# Patient Record
Sex: Male | Born: 1955 | Race: White | Hispanic: No | Marital: Married | Smoking: Never smoker
Health system: Southern US, Community
[De-identification: ages and names within clinical notes are randomized; demographics above are authoritative.]

## PROBLEM LIST (undated history)

## (undated) DIAGNOSIS — M1990 Unspecified osteoarthritis, unspecified site: Secondary | ICD-10-CM

## (undated) DIAGNOSIS — J069 Acute upper respiratory infection, unspecified: Secondary | ICD-10-CM

## (undated) DIAGNOSIS — Z9889 Other specified postprocedural states: Secondary | ICD-10-CM

## (undated) DIAGNOSIS — N138 Other obstructive and reflux uropathy: Secondary | ICD-10-CM

## (undated) DIAGNOSIS — N529 Male erectile dysfunction, unspecified: Secondary | ICD-10-CM

## (undated) DIAGNOSIS — R740 Nonspecific elevation of levels of transaminase and lactic acid dehydrogenase [LDH]: Secondary | ICD-10-CM

## (undated) DIAGNOSIS — E669 Obesity, unspecified: Secondary | ICD-10-CM

## (undated) DIAGNOSIS — N401 Enlarged prostate with lower urinary tract symptoms: Secondary | ICD-10-CM

## (undated) DIAGNOSIS — R5383 Other fatigue: Secondary | ICD-10-CM

## (undated) DIAGNOSIS — J309 Allergic rhinitis, unspecified: Secondary | ICD-10-CM

## (undated) DIAGNOSIS — IMO0001 Reserved for inherently not codable concepts without codable children: Secondary | ICD-10-CM

## (undated) DIAGNOSIS — R112 Nausea with vomiting, unspecified: Secondary | ICD-10-CM

## (undated) DIAGNOSIS — R5381 Other malaise: Secondary | ICD-10-CM

## (undated) DIAGNOSIS — E1165 Type 2 diabetes mellitus with hyperglycemia: Secondary | ICD-10-CM

## (undated) DIAGNOSIS — J189 Pneumonia, unspecified organism: Secondary | ICD-10-CM

## (undated) DIAGNOSIS — E785 Hyperlipidemia, unspecified: Secondary | ICD-10-CM

## (undated) DIAGNOSIS — N400 Enlarged prostate without lower urinary tract symptoms: Secondary | ICD-10-CM

## (undated) DIAGNOSIS — M66259 Spontaneous rupture of extensor tendons, unspecified thigh: Secondary | ICD-10-CM

## (undated) DIAGNOSIS — I10 Essential (primary) hypertension: Secondary | ICD-10-CM

## (undated) DIAGNOSIS — K760 Fatty (change of) liver, not elsewhere classified: Secondary | ICD-10-CM

## (undated) DIAGNOSIS — R7401 Elevation of levels of liver transaminase levels: Secondary | ICD-10-CM

## (undated) DIAGNOSIS — G4730 Sleep apnea, unspecified: Secondary | ICD-10-CM

## (undated) HISTORY — DX: Essential (primary) hypertension: I10

## (undated) HISTORY — DX: Other obstructive and reflux uropathy: N13.8

## (undated) HISTORY — DX: Hyperlipidemia, unspecified: E78.5

## (undated) HISTORY — DX: Type 2 diabetes mellitus with hyperglycemia: E11.65

## (undated) HISTORY — DX: Other malaise: R53.81

## (undated) HISTORY — DX: Benign prostatic hyperplasia without lower urinary tract symptoms: N40.0

## (undated) HISTORY — DX: Fatty (change of) liver, not elsewhere classified: K76.0

## (undated) HISTORY — PX: TONSILLECTOMY: SUR1361

## (undated) HISTORY — DX: Spontaneous rupture of extensor tendons, unspecified thigh: M66.259

## (undated) HISTORY — DX: Male erectile dysfunction, unspecified: N52.9

## (undated) HISTORY — PX: OTHER SURGICAL HISTORY: SHX169

## (undated) HISTORY — DX: Obesity, unspecified: E66.9

## (undated) HISTORY — DX: Other obstructive and reflux uropathy: N40.1

## (undated) HISTORY — DX: Nonspecific elevation of levels of transaminase and lactic acid dehydrogenase (ldh): R74.0

## (undated) HISTORY — DX: Allergic rhinitis, unspecified: J30.9

## (undated) HISTORY — DX: Elevation of levels of liver transaminase levels: R74.01

## (undated) HISTORY — DX: Reserved for inherently not codable concepts without codable children: IMO0001

## (undated) HISTORY — DX: Other fatigue: R53.83

## (undated) HISTORY — DX: Sleep apnea, unspecified: G47.30

## (undated) HISTORY — DX: Acute upper respiratory infection, unspecified: J06.9

---

## 2011-05-11 LAB — HM DIABETES EYE EXAM

## 2013-03-24 ENCOUNTER — Other Ambulatory Visit: Payer: Self-pay

## 2013-03-31 ENCOUNTER — Ambulatory Visit: Payer: Self-pay | Admitting: Family Medicine

## 2013-04-26 ENCOUNTER — Ambulatory Visit (INDEPENDENT_AMBULATORY_CARE_PROVIDER_SITE_OTHER): Payer: No Typology Code available for payment source | Admitting: Family Medicine

## 2013-04-26 ENCOUNTER — Encounter: Payer: Self-pay | Admitting: Family Medicine

## 2013-04-26 VITALS — BP 100/67 | HR 101 | Temp 97.6°F | Wt 211.0 lb

## 2013-04-26 DIAGNOSIS — R5381 Other malaise: Secondary | ICD-10-CM

## 2013-04-26 DIAGNOSIS — J069 Acute upper respiratory infection, unspecified: Secondary | ICD-10-CM

## 2013-04-26 MED ORDER — BENZONATATE 200 MG PO CAPS: 200.0000 mg | ORAL_CAPSULE | Freq: Three times a day (TID) | ORAL | Status: DC | PRN

## 2013-04-26 MED ORDER — MOMETASONE FURO-FORMOTEROL FUM 100-5 MCG/ACT IN AERO: 2.0000 | INHALATION_SPRAY | Freq: Two times a day (BID) | RESPIRATORY_TRACT | Status: DC

## 2013-04-26 MED ORDER — HYDROCODONE-HOMATROPINE 5-1.5 MG/5ML PO SYRP: ORAL_SOLUTION | ORAL | Status: DC

## 2013-04-26 NOTE — Progress Notes (Addendum)
  Subjective:    Patient ID: Meshach Perry, male    DOB: February 24, 1956, 57 y.o.   MRN: [id number]  HPI  Aurelio Brash is here today to have his URI symptoms evaluated.  He was in his normal state of good health until about 2 weeks ago when he developed URI symptoms.  He was seen at an urgent care and was diagnosed with a URI/Sinus Infection and was put on Doxycycline for 10 days.  He has been using Flonase and has been taking his Singulair which have not really helped him very much.  He has been using his Lloyd Huger Med Sinus Rinse.  He avoids decongestants due to his prostate.  His biggest complaint is that he feels very drained.     Review of Systems  Constitutional: Positive for fatigue. Negative for fever, activity change, appetite change and unexpected weight change.  HENT: Positive for congestion and rhinorrhea.   Eyes: Negative.   Respiratory: Positive for cough, chest tightness and shortness of breath.   Cardiovascular: Negative for chest pain and palpitations.    Past Medical History  Diagnosis Date  . Myalgia and myositis, unspecified   . Hypertrophy of prostate with urinary obstruction and other lower urinary tract symptoms (LUTS)   . Type II or unspecified type diabetes mellitus without mention of complication, uncontrolled   . Other malaise and fatigue   . Nonspecific elevation of levels of transaminase or lactic acid dehydrogenase (LDH)   . Essential hypertension, benign   . Other and unspecified hyperlipidemia   . Impotence of organic origin   . Acute upper respiratory infections of other multiple sites   . GERD (gastroesophageal reflux disease)   . BPH (benign prostatic hypertrophy)   . Obesity   . Sleep apnea   . Asthma   . Allergic rhinitis   . Tendon rupture, nontraumatic, quadriceps     Femoris tendon rupture x2  . Fatty liver     Family History  Problem Relation Age of Onset  . Asthma Father   . Heart disease Maternal Uncle   . Heart disease Paternal Uncle   . Heart disease  Maternal Grandmother   . Heart disease Maternal Grandfather   . Cancer Paternal Grandmother     Stomach Cancer  . Heart disease Paternal Grandmother   . Heart disease Paternal Grandfather     History   Social History Narrative   Marital Status: Married Education officer, community)   Children:  Daughter Marchelle Folks)    Pets: Dogs ([place], [place], Glass blower/designer)    Living Situation: Lives with wife    Occupation: Self- employed Scientist, clinical (histocompatibility and immunogenetics))   Education:  10 th grade   Tobacco Use/Exposure: None   Alcohol Use: None   Drug Use: None   Diet: Regular    Exercise: He stays active at work.     Hobbies: Fishing                 Objective:   Physical Exam  Constitutional: He appears well-nourished. No distress.  HENT:  Mouth/Throat: No oropharyngeal exudate.  Eyes: Conjunctivae are normal.  Neck: Neck supple.  Cardiovascular: Normal rate, regular rhythm and normal heart sounds.   Pulmonary/Chest: Effort normal and breath sounds normal. No respiratory distress. He has no wheezes. He has no rales.  Lymphadenopathy:    He has no cervical adenopathy.          Assessment & Plan:

## 2013-04-26 NOTE — Patient Instructions (Addendum)
1)  Bronchitis - Umcka Cold Care Drops - Take 2 droppers on tongue 3 times per day.    Acute Bronchitis You have acute bronchitis. This means you have a chest cold. The airways in your lungs are red and sore (inflamed). Acute means it is sudden onset.  CAUSES Bronchitis is most often caused by the same virus that causes a cold. SYMPTOMS   Body aches.  Chest congestion.  Chills.  Cough.  Fever.  Shortness of breath.  Sore throat. TREATMENT  Acute bronchitis is usually treated with rest, fluids, and medicines for relief of fever or cough. Most symptoms should go away after a few days or a week. Increased fluids may help thin your secretions and will prevent dehydration. Your caregiver may give you an inhaler to improve your symptoms. The inhaler reduces shortness of breath and helps control cough. You can take over-the-counter pain relievers or cough medicine to decrease coughing, pain, or fever. A cool-air vaporizer may help thin bronchial secretions and make it easier to clear your chest. Antibiotics are usually not needed but can be prescribed if you smoke, are seriously ill, have chronic lung problems, are elderly, or you are at higher risk for developing complications.Allergies and asthma can make bronchitis worse. Repeated episodes of bronchitis may cause longstanding lung problems. Avoid smoking and secondhand smoke.Exposure to cigarette smoke or irritating chemicals will make bronchitis worse. If you are a cigarette smoker, consider using nicotine gum or skin patches to help control withdrawal symptoms. Quitting smoking will help your lungs heal faster. Recovery from bronchitis is often slow, but you should start feeling better after 2 to 3 days. Cough from bronchitis frequently lasts for 3 to 4 weeks. To prevent another bout of acute bronchitis:  Quit smoking.  Wash your hands frequently to get rid of viruses or use a hand sanitizer.  Avoid other people with cold or virus  symptoms.  Try not to touch your hands to your mouth, nose, or eyes. SEEK IMMEDIATE MEDICAL CARE IF:  You develop increased fever, chills, or chest pain.  You have severe shortness of breath or bloody sputum.  You develop dehydration, fainting, repeated vomiting, or a severe headache.  You have no improvement after 1 week of treatment or you get worse. MAKE SURE YOU:   Understand these instructions.  Will watch your condition.  Will get help right away if you are not doing well or get worse. Document Released: 12/04/2004 Document Revised: 01/19/2012 Document Reviewed: 02/19/2011 [facility] Patient Information 2014 [place], [place].

## 2013-04-27 MED ORDER — CYANOCOBALAMIN 1000 MCG/ML IJ SOLN
1000.0000 ug | Freq: Once | INTRAMUSCULAR | Status: AC
  Administered 2013-04-26: 1000 ug via INTRAMUSCULAR

## 2013-04-27 MED ORDER — METHYLPREDNISOLONE SODIUM SUCC 125 MG IJ SOLR
125.0000 mg | Freq: Once | INTRAMUSCULAR | Status: AC
  Administered 2013-04-26: 125 mg via INTRAMUSCULAR

## 2013-05-17 ENCOUNTER — Telehealth: Payer: Self-pay | Admitting: Family Medicine

## 2013-05-18 ENCOUNTER — Ambulatory Visit (INDEPENDENT_AMBULATORY_CARE_PROVIDER_SITE_OTHER): Payer: No Typology Code available for payment source | Admitting: Family Medicine

## 2013-05-18 ENCOUNTER — Encounter: Payer: Self-pay | Admitting: Family Medicine

## 2013-05-18 VITALS — BP 110/73 | HR 101 | Ht 65.0 in | Wt 207.0 lb

## 2013-05-18 DIAGNOSIS — E119 Type 2 diabetes mellitus without complications: Secondary | ICD-10-CM

## 2013-05-18 DIAGNOSIS — R5381 Other malaise: Secondary | ICD-10-CM

## 2013-05-18 DIAGNOSIS — J069 Acute upper respiratory infection, unspecified: Secondary | ICD-10-CM

## 2013-05-18 DIAGNOSIS — E669 Obesity, unspecified: Secondary | ICD-10-CM

## 2013-05-18 DIAGNOSIS — R5383 Other fatigue: Secondary | ICD-10-CM

## 2013-05-18 LAB — POCT GLYCOSYLATED HEMOGLOBIN (HGB A1C): Hemoglobin A1C: 5.8

## 2013-05-18 NOTE — Patient Instructions (Addendum)
1)  BP - Hold the lisinopril for now. Start back on 1/2 if needed.  2)  [facility] and/or Dr. Bryson Ha   Fatigue Fatigue is a feeling of tiredness, lack of energy, lack of motivation, or feeling tired all the time. Having enough rest, good nutrition, and reducing stress will normally reduce fatigue. Consult your caregiver if it persists. The nature of your fatigue will help your caregiver to find out its cause. The treatment is based on the cause.  CAUSES  There are many causes for fatigue. Most of the time, fatigue can be traced to one or more of your habits or routines. Most causes fit into one or more of three general areas. They are: Lifestyle problems  Sleep disturbances.  Overwork.  Physical exertion.  Unhealthy habits.  Poor eating habits or eating disorders.  Alcohol and/or drug use .  Lack of proper nutrition (malnutrition). Psychological problems  Stress and/or anxiety problems.  Depression.  Grief.  Boredom. Medical Problems or Conditions  Anemia.  Pregnancy.  Thyroid gland problems.  Recovery from major surgery.  Continuous pain.  Emphysema or asthma that is not well controlled  Allergic conditions.  Diabetes.  Infections (such as mononucleosis).  Obesity.  Sleep disorders, such as sleep apnea.  Heart failure or other heart-related problems.  Cancer.  Kidney disease.  Liver disease.  Effects of certain medicines such as antihistamines, cough and cold remedies, prescription pain medicines, heart and blood pressure medicines, drugs used for treatment of cancer, and some antidepressants. SYMPTOMS  The symptoms of fatigue include:   Lack of energy.  Lack of drive (motivation).  Drowsiness.  Feeling of indifference to the surroundings. DIAGNOSIS  The details of how you feel help guide your caregiver in finding out what is causing the fatigue. You will be asked about your present and past health condition. It is important to review all  medicines that you take, including prescription and non-prescription items. A thorough exam will be done. You will be questioned about your feelings, habits, and normal lifestyle. Your caregiver may suggest blood tests, urine tests, or other tests to look for common medical causes of fatigue.  TREATMENT  Fatigue is treated by correcting the underlying cause. For example, if you have continuous pain or depression, treating these causes will improve how you feel. Similarly, adjusting the dose of certain medicines will help in reducing fatigue.  HOME CARE INSTRUCTIONS   Try to get the required amount of good sleep every night.  Eat a healthy and nutritious diet, and drink enough water throughout the day.  Practice ways of relaxing (including yoga or meditation).  Exercise regularly.  Make plans to change situations that cause stress. Act on those plans so that stresses decrease over time. Keep your work and personal routine reasonable.  Avoid street drugs and minimize use of alcohol.  Start taking a daily multivitamin after consulting your caregiver. SEEK MEDICAL CARE IF:   You have persistent tiredness, which cannot be accounted for.  You have fever.  You have unintentional weight loss.  You have headaches.  You have disturbed sleep throughout the night.  You are feeling sad.  You have constipation.  You have dry skin.  You have gained weight.  You are taking any new or different medicines that you suspect are causing fatigue.  You are unable to sleep at night.  You develop any unusual swelling of your legs or other parts of your body. SEEK IMMEDIATE MEDICAL CARE IF:   You are feeling  confused.  Your vision is blurred.  You feel faint or pass out.  You develop severe headache.  You develop severe abdominal, pelvic, or back pain.  You develop chest pain, shortness of breath, or an irregular or fast heartbeat.  You are unable to pass a normal amount of  urine.  You develop abnormal bleeding such as bleeding from the rectum or you vomit blood.  You have thoughts about harming yourself or committing suicide.  You are worried that you might harm someone else. MAKE SURE YOU:   Understand these instructions.  Will watch your condition.  Will get help right away if you are not doing well or get worse. Document Released: 08/24/2007 Document Revised: 01/19/2012 Document Reviewed: 08/24/2007 [facility] Patient Information 2014 [place], [place].

## 2013-05-18 NOTE — Progress Notes (Signed)
Subjective:    Patient ID: Joseph Conway, male    DOB: 07-11-1956, 57 y.o.   MRN: [id number]  HPI  Joseph Conway is here today with his wife Claris Gladden complaining of URI symptoms that he has been having for the past 6 weeks. He was here about 2 weeks ago for the same symptoms. He feels that his symptoms are not improving. He feels more fatigued. He has taken the inhalers and cough meds given but feels that he is not getting any better.  Review of Systems  Constitutional: Negative.   HENT: Positive for congestion, sore throat and sinus pressure.   Eyes: Negative.   Respiratory: Positive for wheezing.   Cardiovascular: Negative.   Gastrointestinal: Negative.   Endocrine: Negative.   Genitourinary: Negative.   Musculoskeletal: Negative.   Skin: Negative.   Allergic/Immunologic: Negative.   Neurological: Positive for dizziness.  Hematological: Negative.   Psychiatric/Behavioral: Negative.     Past Medical History  Diagnosis Date  . Myalgia and myositis, unspecified   . Hypertrophy of prostate with urinary obstruction and other lower urinary tract symptoms (LUTS)   . Type II or unspecified type diabetes mellitus without mention of complication, uncontrolled   . Other malaise and fatigue   . Nonspecific elevation of levels of transaminase or lactic acid dehydrogenase (LDH)   . Essential hypertension, benign   . Other and unspecified hyperlipidemia   . Impotence of organic origin   . Acute upper respiratory infections of other multiple sites   . GERD (gastroesophageal reflux disease)   . BPH (benign prostatic hypertrophy)   . Obesity   . Sleep apnea   . Asthma   . Allergic rhinitis   . Tendon rupture, nontraumatic, quadriceps     Femoris tendon rupture x2  . Fatty liver     Family History  Problem Relation Age of Onset  . Asthma Father   . Heart disease Maternal Uncle   . Heart disease Paternal Uncle   . Heart disease Maternal Grandmother   . Heart disease Maternal Grandfather   .  Cancer Paternal Grandmother     Stomach Cancer  . Heart disease Paternal Grandmother   . Heart disease Paternal Grandfather     History   Social History Narrative   Marital Status: Married Education officer, community)   Children:  Daughter Marchelle Folks)    Pets: Dogs ([place], [place], Glass blower/designer)    Living Situation: Lives with wife    Occupation: Self- employed Scientist, clinical (histocompatibility and immunogenetics))   Education:  10 th grade   Tobacco Use/Exposure: None   Alcohol Use: None   Drug Use: None   Diet: Regular    Exercise: He stays active at work.     Hobbies: Fishing               Objective:   Physical Exam  Vitals reviewed. Constitutional: He appears well-nourished.  HENT:  Head: Normocephalic.  Nose: Nose normal.  Mouth/Throat: Oropharynx is clear and moist.  Eyes: Conjunctivae are normal. No scleral icterus.  Neck: Neck supple. No thyromegaly present.  Cardiovascular: Normal rate, regular rhythm and normal heart sounds.   Pulmonary/Chest: Effort normal and breath sounds normal.  Abdominal: Soft. He exhibits no mass. There is no tenderness.  Musculoskeletal: Normal range of motion.  Lymphadenopathy:    He has no cervical adenopathy.  Neurological: He is alert.  Skin: Skin is warm and dry. No rash noted.  Psychiatric: He has a normal mood and affect. His behavior is normal. Judgment and thought content normal.  Assessment & Plan:

## 2013-05-18 NOTE — Telephone Encounter (Signed)
Patient made appt for 05/18/13

## 2013-05-25 MED ORDER — PHENDIMETRAZINE TARTRATE 35 MG PO TABS: 1.0000 | ORAL_TABLET | Freq: Three times a day (TID) | ORAL | Status: DC

## 2013-05-29 DIAGNOSIS — R5381 Other malaise: Secondary | ICD-10-CM | POA: Insufficient documentation

## 2013-05-29 DIAGNOSIS — J069 Acute upper respiratory infection, unspecified: Secondary | ICD-10-CM | POA: Insufficient documentation

## 2013-05-29 DIAGNOSIS — R5383 Other fatigue: Secondary | ICD-10-CM | POA: Insufficient documentation

## 2013-05-29 NOTE — Assessment & Plan Note (Addendum)
He is to try some [facility].  He was also given a sample and prescription for [facility].

## 2013-05-29 NOTE — Assessment & Plan Note (Signed)
He was given a Vitamin B-12 Shot.

## 2013-06-19 ENCOUNTER — Encounter: Payer: Self-pay | Admitting: Family Medicine

## 2013-06-19 DIAGNOSIS — E119 Type 2 diabetes mellitus without complications: Secondary | ICD-10-CM | POA: Insufficient documentation

## 2013-06-19 DIAGNOSIS — E669 Obesity, unspecified: Secondary | ICD-10-CM | POA: Insufficient documentation

## 2013-06-19 NOTE — Assessment & Plan Note (Signed)
We discussed him going to Dr. Bryson Ha for acupuncture vs an ENT.

## 2013-06-19 NOTE — Assessment & Plan Note (Signed)
He has a history of sleep apnea but does not use his CPAP machine because it is uncomfortable.  Another 20 lbs of weight loss would improve this.

## 2013-06-19 NOTE — Assessment & Plan Note (Signed)
He is going to try the HCG diet.

## 2013-06-19 NOTE — Assessment & Plan Note (Signed)
His A1c is very good at 5.8%.  He will remain on his current medications.

## 2013-06-28 ENCOUNTER — Encounter: Payer: No Typology Code available for payment source | Admitting: Family Medicine

## 2013-10-25 ENCOUNTER — Other Ambulatory Visit: Payer: Self-pay | Admitting: Family Medicine

## 2013-10-25 NOTE — Telephone Encounter (Signed)
Medications refills are denied since wife informed us that patient is under the care of a doctor closer to their home. PG

## 2013-10-26 ENCOUNTER — Other Ambulatory Visit: Payer: Self-pay | Admitting: Family Medicine

## 2014-01-24 ENCOUNTER — Other Ambulatory Visit: Payer: Self-pay | Admitting: Family Medicine

## 2016-10-06 ENCOUNTER — Encounter (HOSPITAL_COMMUNITY): Payer: Self-pay | Admitting: *Deleted

## 2016-10-06 NOTE — Progress Notes (Signed)
Need orders in EPIC.  Surgery on 10/08/16.  Thank You.

## 2016-10-07 ENCOUNTER — Ambulatory Visit: Payer: Self-pay | Admitting: Orthopedic Surgery

## 2016-10-07 ENCOUNTER — Encounter (HOSPITAL_COMMUNITY): Payer: Self-pay

## 2016-10-07 ENCOUNTER — Ambulatory Visit (HOSPITAL_COMMUNITY)
Admission: RE | Admit: 2016-10-07 | Discharge: 2016-10-07 | Disposition: A | Discharge status: Home / Self Care | Payer: 59 | Source: Ambulatory Visit | Attending: Orthopedic Surgery | Admitting: Orthopedic Surgery

## 2016-10-07 ENCOUNTER — Encounter (HOSPITAL_COMMUNITY)
Admission: RE | Admit: 2016-10-07 | Discharge: 2016-10-07 | Disposition: A | Discharge status: Home / Self Care | Payer: 59 | Source: Ambulatory Visit | Attending: Specialist | Admitting: Specialist

## 2016-10-07 ENCOUNTER — Encounter (INDEPENDENT_AMBULATORY_CARE_PROVIDER_SITE_OTHER): Payer: Self-pay

## 2016-10-07 DIAGNOSIS — M48061 Spinal stenosis, lumbar region without neurogenic claudication: Secondary | ICD-10-CM | POA: Insufficient documentation

## 2016-10-07 DIAGNOSIS — M4805 Spinal stenosis, thoracolumbar region: Secondary | ICD-10-CM | POA: Insufficient documentation

## 2016-10-07 DIAGNOSIS — M1288 Other specific arthropathies, not elsewhere classified, other specified site: Secondary | ICD-10-CM | POA: Diagnosis not present

## 2016-10-07 DIAGNOSIS — M4807 Spinal stenosis, lumbosacral region: Secondary | ICD-10-CM | POA: Insufficient documentation

## 2016-10-07 DIAGNOSIS — Z01810 Encounter for preprocedural cardiovascular examination: Secondary | ICD-10-CM | POA: Insufficient documentation

## 2016-10-07 DIAGNOSIS — M5126 Other intervertebral disc displacement, lumbar region: Secondary | ICD-10-CM

## 2016-10-07 HISTORY — DX: Unspecified osteoarthritis, unspecified site: M19.90

## 2016-10-07 HISTORY — DX: Pneumonia, unspecified organism: J18.9

## 2016-10-07 HISTORY — DX: Nausea with vomiting, unspecified: R11.2

## 2016-10-07 HISTORY — DX: Other specified postprocedural states: Z98.890

## 2016-10-07 LAB — ABO/RH: ABO/RH(D): O POS

## 2016-10-07 LAB — BASIC METABOLIC PANEL
Anion gap: 8 (ref 5–15)
BUN: 19 mg/dL (ref 6–20)
CALCIUM: 9.1 mg/dL (ref 8.9–10.3)
CHLORIDE: 104 mmol/L (ref 101–111)
CO2: 23 mmol/L (ref 22–32)
CREATININE: 0.63 mg/dL (ref 0.61–1.24)
GFR calc non Af Amer: >60 mL/min (ref >60)
GLUCOSE: 116 mg/dL — AB (ref 65–99)
Potassium: 4 mmol/L (ref 3.5–5.1)
Sodium: 135 mmol/L (ref 135–145)

## 2016-10-07 LAB — CBC
HCT: 47 % (ref 39.0–52.0)
Hemoglobin: 16.2 g/dL (ref 13.0–17.0)
MCH: 32.8 pg (ref 26.0–34.0)
MCHC: 34.5 g/dL (ref 30.0–36.0)
MCV: 95.1 fL (ref 78.0–100.0)
PLATELETS: 204 10*3/uL (ref 150–400)
RBC: 4.94 MIL/uL (ref 4.22–5.81)
RDW: 14.5 % (ref 11.5–15.5)
WBC: 9.2 10*3/uL (ref 4.0–10.5)

## 2016-10-07 LAB — SURGICAL PCR SCREEN
MRSA, PCR: NEGATIVE
Staphylococcus aureus: NEGATIVE

## 2016-10-07 LAB — GLUCOSE, CAPILLARY: Glucose-Capillary: 142 mg/dL — ABNORMAL HIGH (ref 65–99)

## 2016-10-07 NOTE — Patient Instructions (Addendum)
Joseph Conway  10/07/2016   Your procedure is scheduled on: 10/08/2016    Report to [facility] Main  Entrance take [facility]  elevators to 3rd floor to  Short Stay Center at    1030 AM.  Call this number if you have problems the morning of surgery [phone]   Remember: ONLY 1 PERSON MAY GO WITH YOU TO SHORT STAY TO GET  READY MORNING OF YOUR SURGERY.  Do not eat food or drink liquids :After Midnight.     Take these medicines the morning of surgery with A SIP OF WATER: flonase if needed, oxycodone if needed  DO NOT TAKE ANY DIABETIC MEDICATIONS DAY OF YOUR SURGERY                               You may not have any metal on your body including hair pins and              piercings  Do not wear jewelry,  lotions, powders or perfumes, deodorant               Men may shave face and neck.   Do not bring valuables to the hospital. [facility] IS NOT             RESPONSIBLE   FOR VALUABLES.  Contacts, dentures or bridgework may not be worn into surgery.  Leave suitcase in the car. After surgery it may be brought to your room.       Special Instructions: N/A              Please read over the following fact sheets you were given: _____________________________________________________________________             [facility] - Preparing for Surgery Before surgery, you can play an important role.  Because skin is not sterile, your skin needs to be as free of germs as possible.  You can reduce the number of germs on your skin by washing with CHG (chlorahexidine gluconate) soap before surgery.  CHG is an antiseptic cleaner which kills germs and bonds with the skin to continue killing germs even after washing. Please DO NOT use if you have an allergy to CHG or antibacterial soaps.  If your skin becomes reddened/irritated stop using the CHG and inform your nurse when you arrive at Short Stay. Do not shave (including legs and underarms) for at least 48 hours prior to the  first CHG shower.  You may shave your face/neck. Please follow these instructions carefully:  1.  Shower with CHG Soap the night before surgery and the  morning of Surgery.  2.  If you choose to wash your hair, wash your hair first as usual with your  normal  shampoo.  3.  After you shampoo, rinse your hair and body thoroughly to remove the  shampoo.                           4.  Use CHG as you would any other liquid soap.  You can apply chg directly  to the skin and wash                       Gently with a scrungie or clean washcloth.  5.  Apply the CHG Soap to  your body ONLY FROM THE NECK DOWN.   Do not use on face/ open                           Wound or open sores. Avoid contact with eyes, ears mouth and genitals (private parts).                       Wash face,  Genitals (private parts) with your normal soap.             6.  Wash thoroughly, paying special attention to the area where your surgery  will be performed.  7.  Thoroughly rinse your body with warm water from the neck down.  8.  DO NOT shower/wash with your normal soap after using and rinsing off  the CHG Soap.                9.  Pat yourself dry with a clean towel.            10.  Wear clean pajamas.            11.  Place clean sheets on your bed the night of your first shower and do not  sleep with pets. Day of Surgery : Do not apply any lotions/deodorants the morning of surgery.  Please wear clean clothes to the hospital/surgery center.  FAILURE TO FOLLOW THESE INSTRUCTIONS MAY RESULT IN THE CANCELLATION OF YOUR SURGERY PATIENT SIGNATURE_________________________________  NURSE SIGNATURE__________________________________  ________________________________________________________________________ How to Manage Your Diabetes Before and After Surgery  Why is it important to control my blood sugar before and after surgery? . Improving blood sugar levels before and after surgery helps healing and can limit problems. . A way of  improving blood sugar control is eating a healthy diet by: o  Eating less sugar and carbohydrates o  Increasing activity/exercise o  Talking with your doctor about reaching your blood sugar goals . High blood sugars (greater than 180 mg/dL) can raise your risk of infections and slow your recovery, so you will need to focus on controlling your diabetes during the weeks before surgery. . Make sure that the doctor who takes care of your diabetes knows about your planned surgery including the date and location.  How do I manage my blood sugar before surgery? . Check your blood sugar at least 4 times a day, starting 2 days before surgery, to make sure that the level is not too high or low. o Check your blood sugar the morning of your surgery when you wake up and every 2 hours until you get to the Short Stay unit. . If your blood sugar is less than 70 mg/dL, you will need to treat for low blood sugar: o Do not take insulin. o Treat a low blood sugar (less than 70 mg/dL) with  cup of clear juice (cranberry or apple), 4 glucose tablets, OR glucose gel. o Recheck blood sugar in 15 minutes after treatment (to make sure it is greater than 70 mg/dL). If your blood sugar is not greater than 70 mg/dL on recheck, call [phone] for further instructions. . Report your blood sugar to the short stay nurse when you get to Short Stay.  . If you are admitted to the hospital after surgery: o Your blood sugar will be checked by the staff and you will probably be given insulin after surgery (instead of oral diabetes medicines) to make sure you have good  blood sugar levels. o The goal for blood sugar control after surgery is 80-180 mg/dL.   WHAT DO I DO ABOUT MY DIABETES MEDICATION?  Marland Kitchen Do not take oral diabetes medicines (pills) the morning of surgery.  . THE NIGHT BEFORE SURGERY, take ___________ units of ___________insulin.       . THE MORNING OF SURGERY, take _____________ units of  __________insulin.  . The day of surgery, do not take other diabetes injectables, including Byetta (exenatide), Bydureon (exenatide ER), Victoza (liraglutide), or Trulicity (dulaglutide).  . If your CBG is greater than 220 mg/dL, you may take  of your sliding scale  . (correction) dose of insulin.    For patients with insulin pumps: Contact your diabetes doctor for specific instructions before surgery. Decrease basal rates by 20% at midnight the night before your surgery. Note that if your surgery is planned to be longer than 2 hours, your insulin pump will be removed and intravenous (IV) insulin will be started and managed by the nurses and the anesthesiologist. You will be able to restart your insulin pump once you are awake and able to manage it.  Make sure to bring insulin pump supplies to the hospital with you in case the  site needs to be changed.  Patient Signature:  Date:   Nurse Signature:  Date:   Reviewed and Endorsed by [facility] Patient Education Committee, August 2015 WHAT IS A BLOOD TRANSFUSION? Blood Transfusion Information  A transfusion is the replacement of blood or some of its parts. Blood is made up of multiple cells which provide different functions.  Red blood cells carry oxygen and are used for blood loss replacement.  White blood cells fight against infection.  Platelets control bleeding.  Plasma helps clot blood.  Other blood products are available for specialized needs, such as hemophilia or other clotting disorders. BEFORE THE TRANSFUSION  Who gives blood for transfusions?   Healthy volunteers who are fully evaluated to make sure their blood is safe. This is blood bank blood. Transfusion therapy is the safest it has ever been in the practice of medicine. Before blood is taken from a donor, a complete history is taken to make sure that person has no history of diseases nor engages in risky social behavior (examples are intravenous drug use or sexual  activity with multiple partners). The donor's travel history is screened to minimize risk of transmitting infections, such as malaria. The donated blood is tested for signs of infectious diseases, such as HIV and hepatitis. The blood is then tested to be sure it is compatible with you in order to minimize the chance of a transfusion reaction. If you or a relative donates blood, this is often done in anticipation of surgery and is not appropriate for emergency situations. It takes many days to process the donated blood. RISKS AND COMPLICATIONS Although transfusion therapy is very safe and saves many lives, the main dangers of transfusion include:   Getting an infectious disease.  Developing a transfusion reaction. This is an allergic reaction to something in the blood you were given. Every precaution is taken to prevent this. The decision to have a blood transfusion has been considered carefully by your caregiver before blood is given. Blood is not given unless the benefits outweigh the risks. AFTER THE TRANSFUSION  Right after receiving a blood transfusion, you will usually feel much better and more energetic. This is especially true if your red blood cells have gotten low (anemic). The transfusion raises the level of  the red blood cells which carry oxygen, and this usually causes an energy increase.  The nurse administering the transfusion will monitor you carefully for complications. HOME CARE INSTRUCTIONS  No special instructions are needed after a transfusion. You may find your energy is better. Speak with your caregiver about any limitations on activity for underlying diseases you may have. SEEK MEDICAL CARE IF:   Your condition is not improving after your transfusion.  You develop redness or irritation at the intravenous (IV) site. SEEK IMMEDIATE MEDICAL CARE IF:  Any of the following symptoms occur over the next 12 hours:  Shaking chills.  You have a temperature by mouth above 102 F  (38.9 C), not controlled by medicine.  Chest, back, or muscle pain.  People around you feel you are not acting correctly or are confused.  Shortness of breath or difficulty breathing.  Dizziness and fainting.  You get a rash or develop hives.  You have a decrease in urine output.  Your urine turns a dark color or changes to pink, red, or brown. Any of the following symptoms occur over the next 10 days:  You have a temperature by mouth above 102 F (38.9 C), not controlled by medicine.  Shortness of breath.  Weakness after normal activity.  The white part of the eye turns yellow (jaundice).  You have a decrease in the amount of urine or are urinating less often.  Your urine turns a dark color or changes to pink, red, or brown. Document Released: 10/24/2000 Document Revised: 01/19/2012 Document Reviewed: 06/12/2008 ExitCare Patient Information 2014 [place], [place].  _______________________________________________________________________  Incentive Spirometer  An incentive spirometer is a tool that can help keep your lungs clear and active. This tool measures how well you are filling your lungs with each breath. Taking long deep breaths may help reverse or decrease the chance of developing breathing (pulmonary) problems (especially infection) following:  A long period of time when you are unable to move or be active. BEFORE THE PROCEDURE   If the spirometer includes an indicator to show your best effort, your nurse or respiratory therapist will set it to a desired goal.  If possible, sit up straight or lean slightly forward. Try not to slouch.  Hold the incentive spirometer in an upright position. INSTRUCTIONS FOR USE  1. Sit on the edge of your bed if possible, or sit up as far as you can in bed or on a chair. 2. Hold the incentive spirometer in an upright position. 3. Breathe out normally. 4. Place the mouthpiece in your mouth and seal your lips tightly around  it. 5. Breathe in slowly and as deeply as possible, raising the piston or the ball toward the top of the column. 6. Hold your breath for 3-5 seconds or for as long as possible. Allow the piston or ball to fall to the bottom of the column. 7. Remove the mouthpiece from your mouth and breathe out normally. 8. Rest for a few seconds and repeat Steps 1 through 7 at least 10 times every 1-2 hours when you are awake. Take your time and take a few normal breaths between deep breaths. 9. The spirometer may include an indicator to show your best effort. Use the indicator as a goal to work toward during each repetition. 10. After each set of 10 deep breaths, practice coughing to be sure your lungs are clear. If you have an incision (the cut made at the time of surgery), support your incision when coughing by  placing a pillow or rolled up towels firmly against it. Once you are able to get out of bed, walk around indoors and cough well. You may stop using the incentive spirometer when instructed by your caregiver.  RISKS AND COMPLICATIONS  Take your time so you do not get dizzy or light-headed.  If you are in pain, you may need to take or ask for pain medication before doing incentive spirometry. It is harder to take a deep breath if you are having pain. AFTER USE  Rest and breathe slowly and easily.  It can be helpful to keep track of a log of your progress. Your caregiver can provide you with a simple table to help with this. If you are using the spirometer at home, follow these instructions: SEEK MEDICAL CARE IF:   You are having difficultly using the spirometer.  You have trouble using the spirometer as often as instructed.  Your pain medication is not giving enough relief while using the spirometer.  You develop fever of 100.5 F (38.1 C) or higher. SEEK IMMEDIATE MEDICAL CARE IF:   You cough up bloody sputum that had not been present before.  You develop fever of 102 F (38.9 C) or  greater.  You develop worsening pain at or near the incision site. MAKE SURE YOU:   Understand these instructions.  Will watch your condition.  Will get help right away if you are not doing well or get worse. Document Released: 03/09/2007 Document Revised: 01/19/2012 Document Reviewed: 05/10/2007 [facility] Patient Information 2014 [place], [place].   ________________________________________________________________________

## 2016-10-07 NOTE — H&P (Signed)
Joseph Conway is an 60 y.o. male.   Chief Complaint: Right followed by left lower extremity radicular pain. HPI: The patient reports low back symptoms including pain which began 1 month(s) ago without any known injury. Symptoms are reported to be located on the right side more than the left and Symptoms include pain, muscle spasm, numbness, burning, tingling and weakness. The pain radiates to the right buttock, right thigh, right posterior thigh, right lower leg and right foot. The patient describes the pain as sharp, dull, burning, aching, tingling and throbbing. The symptom onset was sudden. The patient describes the severity of their symptoms as severe. The patient feels as if the symptoms are worsening. Symptoms are exacerbated by standing, lifting and bending. Symptoms are relieved by nonsteroidal anti-inflammatory drugs and opioid analgesics. Current treatment includes opioid analgesics, oral corticosteroids and chiropractic manipulation. Pertinent medical history includes chronic back pain. Prior to being seen today the patient was previously evaluated in urgent care. Symptoms present at the patient's previous evaluation included back pain and numbness. Past evaluation has included x-ray of the lumbar spine, MRI of the lumbar spine and orthopedic evaluation. Past treatment has included opioid analgesics, muscle relaxants, corticosteroids and chiropractic manipulation. The patient states that this is not a Financial risk analystWorker's Compensation case.  HISTORY OF PRESENT ILLNESS This is a very pleasant gentleman who presents here on referral with continued back and lower extremity radicular pain, worsened as of late on October 13 after a lifting episode. He is here with his wife. He has reported even worsening of the symptoms as of a week ago. 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 Disc protrusion at L3-L4. He also had had radiographs,  indicated mild degeneration in the spine. No fracture was seen.  He has a history of rheumatoid arthritis. He has been placed on prednisone for this pain is he has a baseline prednisone that he takes apparently 8 mg per day and he is taking now 8 mg tablets a day due to this increasing pain. There is a history of type 2 diabetes, vitamin B12 deficiency, rheumatoid arthritis as well as a hypertension. He has seen a neurologist for this as well. [facility]. Given Toradol as well. He is taking ibuprofen and hydrocodone.  REVIEW OF SYSTEMS Review of systems is negative for fevers, chest pain, shortness of breath, unexplained recent weight loss, loss of bowel or bladder function, burning with urination, joint swelling, rashes, weakness or numbness, difficulty with balance, easy bruising, excessive thirst or frequent urination.  He has some pain in his neck with end-extension, but no upper extremity radicular pain.  Past Medical History:  Diagnosis Date  . Acute upper respiratory infections of other multiple sites   . Allergic rhinitis   . Asthma   . BPH (benign prostatic hypertrophy)   . Essential hypertension, benign   . Fatty liver   . GERD (gastroesophageal reflux disease)   . Hypertrophy of prostate with urinary obstruction and other lower urinary tract symptoms (LUTS)   . Impotence of organic origin   . Myalgia and myositis, unspecified   . Nonspecific elevation of levels of transaminase or lactic acid dehydrogenase (LDH)   . Obesity   . Other and unspecified hyperlipidemia   . Other malaise and fatigue   . Sleep apnea   . Tendon rupture, nontraumatic, quadriceps    Femoris tendon rupture x2  . Type II  or unspecified type diabetes mellitus without mention of complication, uncontrolled     No past surgical history on file.  Family History  Problem Relation Age of Onset  . Asthma Father   . Heart disease Maternal Uncle   . Heart disease Paternal Uncle   .  Heart disease Maternal Grandmother   . Heart disease Maternal Grandfather   . Cancer Paternal Grandmother     Stomach Cancer  . Heart disease Paternal Grandmother   . Heart disease Paternal Grandfather    Social History:  reports that he has never smoked. He has never used smokeless tobacco. He reports that he does not drink alcohol or use drugs.  Allergies:  Allergies  Allergen Reactions  . Cefaclor Anaphylaxis    Trouble breathing, blindness, had to be hospitalized. Tolerates penicillin.  . Levaquin [Levofloxacin] Other (See Comments)    Tore tendons  . Nabumetone Other (See Comments)    Unknown     (Not in a hospital admission)  No results found for this or any previous visit (from the past 48 hour(s)). No results found.  Review of Systems  Constitutional: Negative.   HENT: Negative.   Eyes: Negative.   Respiratory: Negative.   Cardiovascular: Negative.   Gastrointestinal: Negative.   Genitourinary: Negative.   Musculoskeletal: Positive for back pain.  Skin: Negative.   Neurological: Positive for sensory change and focal weakness.    There were no vitals taken for this visit. Physical Exam  Constitutional: He appears well-developed. He appears distressed.  HENT:  Head: Normocephalic.  Eyes: Pupils are equal, round, and reactive to light.  Neck: Normal range of motion.  Cardiovascular: Normal rate.   Respiratory: Effort normal.  GI: Soft.  Musculoskeletal:  On exam, moderate to severe distress. Mood and affect is appropriate. He walks with an antalgic gait. Straight leg raise bilaterally, buttock, thigh and calf pain. EHL 4+/5 bilaterally. He does have some altered sensation in the L5 dermatome. Limited extension of the lumbar spine.  Lumbar spine exam reveals no evidence of soft tissue swelling, deformity or skin ecchymosis. On palpation there is no tenderness of the lumbar spine. No flank pain with percussion. The abdomen is soft and nontender. Nontender over  the trochanters. No cellulitis or lymphadenopathy.  Motor is 5/5 including tibialis anterior, plantar flexion, quadriceps and hamstrings. Patient is normoreflexic. There is no Babinski or clonus. Patient has good distal pulses. No DVT. No pain and normal range of motion without instability of the hips, knees and ankles.  Cervical spine, some pain with end flexion.  Inspection of the cervical spine reveals a normal lordosis without evidence of paraspinous spasms or soft tissue swelling. Nontender to palpation. Full flexion, full extension, full left and right lateral rotation. Extension does not reproduce pain. Negative impingement sign, negative secondary impingement sign of the shoulders. Negative Tinel's median and ulnar nerves at the elbow. Negative carpal compression test at the wrist. Motor of the upper extremities is 5/5 including biceps, triceps, brachioradialis, wrist flexion, wrist extension, finger flexion, finger extension. Reflexes are normoreflexic. Sensory exam is intact to light touch. There is no Hoffmann sign. Nontender over the thoracic spine.  Neurological: He is alert.  Skin: Skin is warm and dry.   Three-view radiographs of the lumbar spine demonstrates a mild scoliosis. He has disc degeneration. 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  This patient's pain drawing is organic. He sees Dr. Cardell Peach for rheumatoid arthritis.  Assessment/Plan 1. Bilateral lower extremity radicular pain,  acute on chronic secondary to spinal stenosis with associated disc herniation. 2. Rheumatoid arthritis with a large disc herniation at 4-5 with severe stenosis.  We discussed options on him given the severity of symptoms and the disc herniation that is noted. We discussed proceeding with a microlumbar decompression. I had an extensive discussion of the risks and benefits of the lumbar decompression with the patient including bleeding, infection, damage to neurovascular  structures, epidural fibrosis, CSF leak requiring repair. We also discussed increase in pain, adjacent segment disease, recurrent disc herniation, need for future surgery including repeat decompression and/or fusion. We also discussed risks of postoperative hematoma, paralysis, anesthetic complications including DVT, PE, death, cardiopulmonary dysfunction. In addition, the perioperative and postoperative courses were discussed in detail including the rehabilitative time and return to functional activity and work. I provided the patient with an illustrated handout and utilized the appropriate surgical models.  We obtained AP and lateral flexion and extension radiographs of the cervical spine due to his rheumatoid arthritis to evaluate for any underlying C1-C2 instability. I do not see that.  He is currently on a dose of steroid, which he will be tapering back to his baseline. We will attempt to obtain surgical clearance and then proceed with a microlumbar decompression. I do not feel that epidural steroid series would be of particular benefit to him. I discussed a home flexion positioning, avoiding extension, favoring flexion. If he has any changes in bowel or bladder function, he has noted current numbness in his groin, he is to call. We will proceed with urgency. Again, if he is worse in the interim, he is to call. We discussed admission and decompression. We will obtain some additional information concerning his generalized condition in terms of cardiac and pulmonary. We will continue his methotrexate through the procedure and down to his baseline of steroid.  Plan microlumbar decompression L4-5  Dorothy SparkBISSELL, Joseph Yorks M., Joseph Conway for Dr. Shelle IronBeane 10/07/2016, 8:29 AM

## 2016-10-07 NOTE — Progress Notes (Signed)
EKG done 06/2016 on chart.

## 2016-10-08 ENCOUNTER — Encounter (HOSPITAL_COMMUNITY): Payer: Self-pay | Admitting: *Deleted

## 2016-10-08 ENCOUNTER — Ambulatory Visit (HOSPITAL_COMMUNITY): Payer: 59

## 2016-10-08 ENCOUNTER — Observation Stay (HOSPITAL_COMMUNITY)
Admission: RE | Admit: 2016-10-08 | Discharge: 2016-10-09 | Disposition: A | Discharge status: Home / Self Care | Payer: 59 | Source: Ambulatory Visit | Attending: Specialist | Admitting: Specialist

## 2016-10-08 ENCOUNTER — Ambulatory Visit (HOSPITAL_COMMUNITY): Payer: 59 | Admitting: Anesthesiology

## 2016-10-08 ENCOUNTER — Encounter (HOSPITAL_COMMUNITY)
Admission: RE | Disposition: A | Discharge status: Home / Self Care | Payer: Self-pay | Source: Ambulatory Visit | Attending: Specialist

## 2016-10-08 DIAGNOSIS — E669 Obesity, unspecified: Secondary | ICD-10-CM | POA: Insufficient documentation

## 2016-10-08 DIAGNOSIS — E119 Type 2 diabetes mellitus without complications: Secondary | ICD-10-CM | POA: Diagnosis not present

## 2016-10-08 DIAGNOSIS — K760 Fatty (change of) liver, not elsewhere classified: Secondary | ICD-10-CM | POA: Diagnosis not present

## 2016-10-08 DIAGNOSIS — N400 Enlarged prostate without lower urinary tract symptoms: Secondary | ICD-10-CM | POA: Insufficient documentation

## 2016-10-08 DIAGNOSIS — G4730 Sleep apnea, unspecified: Secondary | ICD-10-CM | POA: Insufficient documentation

## 2016-10-08 DIAGNOSIS — Z419 Encounter for procedure for purposes other than remedying health state, unspecified: Secondary | ICD-10-CM

## 2016-10-08 DIAGNOSIS — M1990 Unspecified osteoarthritis, unspecified site: Secondary | ICD-10-CM | POA: Insufficient documentation

## 2016-10-08 DIAGNOSIS — M48061 Spinal stenosis, lumbar region without neurogenic claudication: Secondary | ICD-10-CM | POA: Diagnosis present

## 2016-10-08 DIAGNOSIS — M5126 Other intervertebral disc displacement, lumbar region: Secondary | ICD-10-CM | POA: Diagnosis present

## 2016-10-08 DIAGNOSIS — I10 Essential (primary) hypertension: Secondary | ICD-10-CM | POA: Insufficient documentation

## 2016-10-08 DIAGNOSIS — E785 Hyperlipidemia, unspecified: Secondary | ICD-10-CM | POA: Diagnosis not present

## 2016-10-08 DIAGNOSIS — Z6834 Body mass index (BMI) 34.0-34.9, adult: Secondary | ICD-10-CM | POA: Diagnosis not present

## 2016-10-08 HISTORY — PX: LUMBAR LAMINECTOMY/DECOMPRESSION MICRODISCECTOMY: SHX5026

## 2016-10-08 LAB — GLUCOSE, CAPILLARY
GLUCOSE-CAPILLARY: 173 mg/dL — AB (ref 65–99)
GLUCOSE-CAPILLARY: 208 mg/dL — AB (ref 65–99)
Glucose-Capillary: 154 mg/dL — ABNORMAL HIGH (ref 65–99)

## 2016-10-08 LAB — HEMOGLOBIN A1C
HEMOGLOBIN A1C: 6.3 % — AB (ref 4.8–5.6)
MEAN PLASMA GLUCOSE: 134 mg/dL

## 2016-10-08 LAB — TYPE AND SCREEN
ABO/RH(D): O POS
Antibody Screen: NEGATIVE

## 2016-10-08 SURGERY — LUMBAR LAMINECTOMY/DECOMPRESSION MICRODISCECTOMY 1 LEVEL
Anesthesia: General | Site: Back

## 2016-10-08 MED ORDER — OXYCODONE-ACETAMINOPHEN 5-325 MG PO TABS
1.0000 | ORAL_TABLET | ORAL | Status: DC | PRN
  Administered 2016-10-09: 1 via ORAL
  Administered 2016-10-09: 2 via ORAL
  Filled 2016-10-08: qty 2
  Filled 2016-10-08: qty 1

## 2016-10-08 MED ORDER — PROPOFOL 10 MG/ML IV BOLUS
INTRAVENOUS | Status: DC | PRN
  Administered 2016-10-08: 200 mg via INTRAVENOUS

## 2016-10-08 MED ORDER — METOCLOPRAMIDE HCL 5 MG/ML IJ SOLN
INTRAMUSCULAR | Status: DC | PRN
  Administered 2016-10-08: 10 mg via INTRAVENOUS

## 2016-10-08 MED ORDER — BISACODYL 5 MG PO TBEC: 5.0000 mg | DELAYED_RELEASE_TABLET | Freq: Every day | ORAL | Status: DC | PRN

## 2016-10-08 MED ORDER — ASPIRIN EC 81 MG PO TBEC: 81.0000 mg | DELAYED_RELEASE_TABLET | Freq: Every day | ORAL | Status: AC

## 2016-10-08 MED ORDER — GABAPENTIN 300 MG PO CAPS
300.0000 mg | ORAL_CAPSULE | Freq: Three times a day (TID) | ORAL | Status: DC
  Administered 2016-10-08 – 2016-10-09 (×2): 300 mg via ORAL
  Filled 2016-10-08 (×2): qty 1

## 2016-10-08 MED ORDER — MIDAZOLAM HCL 2 MG/2ML IJ SOLN
INTRAMUSCULAR | Status: AC
  Filled 2016-10-08: qty 2

## 2016-10-08 MED ORDER — ONDANSETRON HCL 4 MG/2ML IJ SOLN: 4.0000 mg | Freq: Four times a day (QID) | INTRAMUSCULAR | Status: DC | PRN

## 2016-10-08 MED ORDER — HYDROMORPHONE HCL 1 MG/ML IJ SOLN: 0.5000 mg | INTRAMUSCULAR | Status: DC | PRN

## 2016-10-08 MED ORDER — MENTHOL 3 MG MT LOZG: 1.0000 | LOZENGE | OROMUCOSAL | Status: DC | PRN

## 2016-10-08 MED ORDER — INSULIN ASPART 100 UNIT/ML ~~LOC~~ SOLN
0.0000 [IU] | Freq: Three times a day (TID) | SUBCUTANEOUS | Status: DC
  Administered 2016-10-09: 3 [IU] via SUBCUTANEOUS

## 2016-10-08 MED ORDER — SUGAMMADEX SODIUM 200 MG/2ML IV SOLN
INTRAVENOUS | Status: DC | PRN
  Administered 2016-10-08: 200 mg via INTRAVENOUS

## 2016-10-08 MED ORDER — VANCOMYCIN HCL IN DEXTROSE 1-5 GM/200ML-% IV SOLN
1000.0000 mg | Freq: Once | INTRAVENOUS | Status: AC
  Administered 2016-10-08: 1000 mg via INTRAVENOUS
  Filled 2016-10-08: qty 200

## 2016-10-08 MED ORDER — LIDOCAINE-EPINEPHRINE 1 %-1:100000 IJ SOLN
INTRAMUSCULAR | Status: DC | PRN
  Administered 2016-10-08: 15 mL

## 2016-10-08 MED ORDER — METOCLOPRAMIDE HCL 5 MG/ML IJ SOLN
INTRAMUSCULAR | Status: AC
  Filled 2016-10-08: qty 2

## 2016-10-08 MED ORDER — POTASSIUM CHLORIDE IN NACL 20-0.45 MEQ/L-% IV SOLN
INTRAVENOUS | Status: DC
  Administered 2016-10-08: 19:00:00 via INTRAVENOUS
  Filled 2016-10-08: qty 1000

## 2016-10-08 MED ORDER — LISINOPRIL 10 MG PO TABS: 10.0000 mg | ORAL_TABLET | Freq: Every day | ORAL | Status: DC

## 2016-10-08 MED ORDER — POLYETHYLENE GLYCOL 3350 17 G PO PACK: 17.0000 g | PACK | Freq: Every day | ORAL | Status: DC | PRN

## 2016-10-08 MED ORDER — METHOCARBAMOL 500 MG PO TABS: 500.0000 mg | ORAL_TABLET | Freq: Four times a day (QID) | ORAL | Status: DC | PRN

## 2016-10-08 MED ORDER — HYDROCODONE-ACETAMINOPHEN 5-325 MG PO TABS
1.0000 | ORAL_TABLET | ORAL | Status: DC | PRN
  Administered 2016-10-08 (×2): 1 via ORAL
  Administered 2016-10-08 – 2016-10-09 (×2): 2 via ORAL
  Filled 2016-10-08: qty 1
  Filled 2016-10-08: qty 2
  Filled 2016-10-08: qty 1
  Filled 2016-10-08: qty 2

## 2016-10-08 MED ORDER — POLYETHYLENE GLYCOL 3350 17 G PO PACK: 17.0000 g | PACK | Freq: Every day | ORAL | 0 refills | Status: AC

## 2016-10-08 MED ORDER — ACETAMINOPHEN 325 MG PO TABS: 650.0000 mg | ORAL_TABLET | ORAL | Status: DC | PRN

## 2016-10-08 MED ORDER — PROPOFOL 10 MG/ML IV BOLUS
INTRAVENOUS | Status: AC
  Filled 2016-10-08: qty 20

## 2016-10-08 MED ORDER — ROCURONIUM BROMIDE 100 MG/10ML IV SOLN
INTRAVENOUS | Status: DC | PRN
  Administered 2016-10-08 (×2): 10 mg via INTRAVENOUS
  Administered 2016-10-08: 50 mg via INTRAVENOUS

## 2016-10-08 MED ORDER — HYDROMORPHONE HCL 1 MG/ML IJ SOLN: 0.2500 mg | INTRAMUSCULAR | Status: DC | PRN

## 2016-10-08 MED ORDER — PHENOL 1.4 % MT LIQD: 1.0000 | OROMUCOSAL | Status: DC | PRN

## 2016-10-08 MED ORDER — SUGAMMADEX SODIUM 500 MG/5ML IV SOLN
INTRAVENOUS | Status: AC
  Filled 2016-10-08: qty 5

## 2016-10-08 MED ORDER — RISAQUAD PO CAPS
1.0000 | ORAL_CAPSULE | Freq: Every day | ORAL | Status: DC
  Administered 2016-10-08 – 2016-10-09 (×2): 1 via ORAL
  Filled 2016-10-08 (×2): qty 1

## 2016-10-08 MED ORDER — ALUM & MAG HYDROXIDE-SIMETH 200-200-20 MG/5ML PO SUSP: 30.0000 mL | Freq: Four times a day (QID) | ORAL | Status: DC | PRN

## 2016-10-08 MED ORDER — HEMOSTATIC AGENTS (NO CHARGE) OPTIME
TOPICAL | Status: DC | PRN
  Administered 2016-10-08: 1 via TOPICAL

## 2016-10-08 MED ORDER — OXYCODONE HCL 5 MG PO TABS: 5.0000 mg | ORAL_TABLET | Freq: Once | ORAL | Status: DC | PRN

## 2016-10-08 MED ORDER — MAGNESIUM CITRATE PO SOLN: 1.0000 | Freq: Once | ORAL | Status: DC | PRN

## 2016-10-08 MED ORDER — SODIUM CHLORIDE 0.9 % IR SOLN
Status: DC | PRN
  Administered 2016-10-08: 500 mL

## 2016-10-08 MED ORDER — CHLORHEXIDINE GLUCONATE 4 % EX LIQD: 60.0000 mL | Freq: Once | CUTANEOUS | Status: DC

## 2016-10-08 MED ORDER — METHOCARBAMOL 500 MG PO TABS: 500.0000 mg | ORAL_TABLET | Freq: Four times a day (QID) | ORAL | 1 refills | Status: AC | PRN

## 2016-10-08 MED ORDER — SODIUM CHLORIDE 0.9 % IR SOLN
Status: AC
  Filled 2016-10-08: qty 500000

## 2016-10-08 MED ORDER — ONDANSETRON HCL 4 MG/2ML IJ SOLN
INTRAMUSCULAR | Status: AC
  Filled 2016-10-08: qty 2

## 2016-10-08 MED ORDER — FENTANYL CITRATE (PF) 100 MCG/2ML IJ SOLN
INTRAMUSCULAR | Status: DC | PRN
  Administered 2016-10-08 (×2): 100 ug via INTRAVENOUS
  Administered 2016-10-08: 50 ug via INTRAVENOUS

## 2016-10-08 MED ORDER — MONTELUKAST SODIUM 10 MG PO TABS
10.0000 mg | ORAL_TABLET | Freq: Every day | ORAL | Status: DC
  Administered 2016-10-08: 10 mg via ORAL
  Filled 2016-10-08: qty 1

## 2016-10-08 MED ORDER — DOCUSATE SODIUM 100 MG PO CAPS
100.0000 mg | ORAL_CAPSULE | Freq: Two times a day (BID) | ORAL | Status: DC
  Administered 2016-10-08 – 2016-10-09 (×2): 100 mg via ORAL
  Filled 2016-10-08 (×2): qty 1

## 2016-10-08 MED ORDER — ONDANSETRON HCL 4 MG/2ML IJ SOLN
INTRAMUSCULAR | Status: DC | PRN
  Administered 2016-10-08: 4 mg via INTRAVENOUS

## 2016-10-08 MED ORDER — LIDOCAINE-EPINEPHRINE 1 %-1:100000 IJ SOLN
INTRAMUSCULAR | Status: AC
  Filled 2016-10-08: qty 1

## 2016-10-08 MED ORDER — METHOTREXATE 2.5 MG PO TABS: 12.5000 mg | ORAL_TABLET | ORAL | Status: DC

## 2016-10-08 MED ORDER — TAMSULOSIN HCL 0.4 MG PO CAPS
0.4000 mg | ORAL_CAPSULE | Freq: Every day | ORAL | Status: DC
  Administered 2016-10-08: 0.4 mg via ORAL
  Filled 2016-10-08: qty 1

## 2016-10-08 MED ORDER — LACTATED RINGERS IV SOLN
INTRAVENOUS | Status: DC
  Administered 2016-10-08 (×2): via INTRAVENOUS

## 2016-10-08 MED ORDER — ONDANSETRON HCL 4 MG/2ML IJ SOLN: 4.0000 mg | INTRAMUSCULAR | Status: DC | PRN

## 2016-10-08 MED ORDER — VANCOMYCIN HCL IN DEXTROSE 1-5 GM/200ML-% IV SOLN
1000.0000 mg | INTRAVENOUS | Status: AC
  Administered 2016-10-08: 1000 mg via INTRAVENOUS
  Filled 2016-10-08: qty 200

## 2016-10-08 MED ORDER — METHOCARBAMOL 1000 MG/10ML IJ SOLN
500.0000 mg | Freq: Four times a day (QID) | INTRAVENOUS | Status: DC | PRN
  Filled 2016-10-08: qty 5

## 2016-10-08 MED ORDER — MIDAZOLAM HCL 5 MG/5ML IJ SOLN
INTRAMUSCULAR | Status: DC | PRN
  Administered 2016-10-08 (×2): 1 mg via INTRAVENOUS

## 2016-10-08 MED ORDER — DEXAMETHASONE SODIUM PHOSPHATE 10 MG/ML IJ SOLN
INTRAMUSCULAR | Status: DC | PRN
  Administered 2016-10-08: 10 mg via INTRAVENOUS

## 2016-10-08 MED ORDER — FOLIC ACID 1 MG PO TABS
1.0000 mg | ORAL_TABLET | Freq: Every day | ORAL | Status: DC
  Administered 2016-10-09: 1 mg via ORAL
  Filled 2016-10-08: qty 1

## 2016-10-08 MED ORDER — FENTANYL CITRATE (PF) 100 MCG/2ML IJ SOLN
INTRAMUSCULAR | Status: AC
  Filled 2016-10-08: qty 2

## 2016-10-08 MED ORDER — ACETAMINOPHEN 650 MG RE SUPP: 650.0000 mg | RECTAL | Status: DC | PRN

## 2016-10-08 MED ORDER — OXYCODONE-ACETAMINOPHEN 5-325 MG PO TABS: 1.0000 | ORAL_TABLET | ORAL | 0 refills | Status: AC | PRN

## 2016-10-08 MED ORDER — DEXAMETHASONE SODIUM PHOSPHATE 10 MG/ML IJ SOLN
INTRAMUSCULAR | Status: AC
  Filled 2016-10-08: qty 1

## 2016-10-08 MED ORDER — SUCCINYLCHOLINE CHLORIDE 20 MG/ML IJ SOLN
INTRAMUSCULAR | Status: DC | PRN
  Administered 2016-10-08: 120 mg via INTRAVENOUS

## 2016-10-08 MED ORDER — LIDOCAINE 2% (20 MG/ML) 5 ML SYRINGE
INTRAMUSCULAR | Status: AC
  Filled 2016-10-08: qty 5

## 2016-10-08 MED ORDER — FENTANYL CITRATE (PF) 100 MCG/2ML IJ SOLN
INTRAMUSCULAR | Status: AC
  Filled 2016-10-08: qty 4

## 2016-10-08 MED ORDER — DOCUSATE SODIUM 100 MG PO CAPS: 100.0000 mg | ORAL_CAPSULE | Freq: Two times a day (BID) | ORAL | 1 refills | Status: AC | PRN

## 2016-10-08 MED ORDER — OXYCODONE HCL 5 MG/5ML PO SOLN: 5.0000 mg | Freq: Once | ORAL | Status: DC | PRN

## 2016-10-08 MED ORDER — LIDOCAINE HCL (CARDIAC) 20 MG/ML IV SOLN
INTRAVENOUS | Status: DC | PRN
  Administered 2016-10-08: 50 mg via INTRATRACHEAL
  Administered 2016-10-08: 100 mg via INTRAVENOUS

## 2016-10-08 MED ORDER — FLUTICASONE PROPIONATE 50 MCG/ACT NA SUSP: 2.0000 | Freq: Every day | NASAL | Status: DC | PRN

## 2016-10-08 SURGICAL SUPPLY — 50 items
CLEANER TIP ELECTROSURG 2X2 (MISCELLANEOUS) ×2 IMPLANT
CLOTH 2% CHLOROHEXIDINE 3PK (PERSONAL CARE ITEMS) ×2 IMPLANT
DRAPE MICROSCOPE LEICA (MISCELLANEOUS) ×2 IMPLANT
DRAPE POUCH INSTRU U-SHP 10X18 (DRAPES) ×2 IMPLANT
DRAPE SHEET LG 3/4 BI-LAMINATE (DRAPES) ×2 IMPLANT
DRAPE SURG 17X11 SM STRL (DRAPES) ×2 IMPLANT
DRAPE UTILITY XL STRL (DRAPES) ×2 IMPLANT
DRESSING AQUACEL AG SP 3.5X6 (GAUZE/BANDAGES/DRESSINGS) ×1 IMPLANT
DRSG AQUACEL AG ADV 3.5X 4 (GAUZE/BANDAGES/DRESSINGS) IMPLANT
DRSG AQUACEL AG ADV 3.5X 6 (GAUZE/BANDAGES/DRESSINGS) IMPLANT
DRSG AQUACEL AG SP 3.5X6 (GAUZE/BANDAGES/DRESSINGS) ×2
DURAPREP 26ML APPLICATOR (WOUND CARE) ×2 IMPLANT
ELECT BLADE TIP CTD 4 INCH (ELECTRODE) IMPLANT
ELECT REM PT RETURN 9FT ADLT (ELECTROSURGICAL) ×2
ELECTRODE REM PT RTRN 9FT ADLT (ELECTROSURGICAL) ×1 IMPLANT
GLOVE BIOGEL PI IND STRL 7.0 (GLOVE) ×1 IMPLANT
GLOVE BIOGEL PI IND STRL 7.5 (GLOVE) ×1 IMPLANT
GLOVE BIOGEL PI INDICATOR 7.0 (GLOVE) ×1
GLOVE BIOGEL PI INDICATOR 7.5 (GLOVE) ×1
GLOVE SURG SS PI 7.0 STRL IVOR (GLOVE) ×2 IMPLANT
GLOVE SURG SS PI 7.5 STRL IVOR (GLOVE) ×4 IMPLANT
GLOVE SURG SS PI 8.0 STRL IVOR (GLOVE) ×4 IMPLANT
GOWN STRL REUS W/TWL 2XL LVL3 (GOWN DISPOSABLE) ×2 IMPLANT
GOWN STRL REUS W/TWL XL LVL3 (GOWN DISPOSABLE) ×4 IMPLANT
HEMOSTAT SPONGE AVITENE ULTRA (HEMOSTASIS) ×2 IMPLANT
IV CATH 14GX2 1/4 (CATHETERS) ×2 IMPLANT
KIT BASIN OR (CUSTOM PROCEDURE TRAY) ×2 IMPLANT
KIT POSITIONING SURG ANDREWS (MISCELLANEOUS) ×2 IMPLANT
MANIFOLD NEPTUNE II (INSTRUMENTS) ×2 IMPLANT
NEEDLE SPNL 18GX3.5 QUINCKE PK (NEEDLE) ×6 IMPLANT
PACK LAMINECTOMY ORTHO (CUSTOM PROCEDURE TRAY) ×2 IMPLANT
PATTIES SURGICAL .5 X.5 (GAUZE/BANDAGES/DRESSINGS) IMPLANT
PATTIES SURGICAL .75X.75 (GAUZE/BANDAGES/DRESSINGS) IMPLANT
PATTIES SURGICAL 1X1 (DISPOSABLE) IMPLANT
RUBBERBAND STERILE (MISCELLANEOUS) ×4 IMPLANT
STAPLER VISISTAT (STAPLE) IMPLANT
STRIP CLOSURE SKIN 1/2X4 (GAUZE/BANDAGES/DRESSINGS) ×2 IMPLANT
SUT NURALON 4 0 TR CR/8 (SUTURE) IMPLANT
SUT PROLENE 3 0 PS 2 (SUTURE) ×2 IMPLANT
SUT VIC AB 1 CT1 27 (SUTURE)
SUT VIC AB 1 CT1 27XBRD ANTBC (SUTURE) IMPLANT
SUT VIC AB 1-0 CT2 27 (SUTURE) ×4 IMPLANT
SUT VIC AB 2-0 CT1 27 (SUTURE)
SUT VIC AB 2-0 CT1 TAPERPNT 27 (SUTURE) IMPLANT
SUT VIC AB 2-0 CT2 27 (SUTURE) ×2 IMPLANT
SYR 3ML LL SCALE MARK (SYRINGE) IMPLANT
TAPE CLOTH SURG 4X10 WHT LF (GAUZE/BANDAGES/DRESSINGS) ×2 IMPLANT
TOWEL OR 17X26 10 PK STRL BLUE (TOWEL DISPOSABLE) ×2 IMPLANT
TOWEL OR NON WOVEN STRL DISP B (DISPOSABLE) IMPLANT
YANKAUER SUCT BULB TIP NO VENT (SUCTIONS) ×2 IMPLANT

## 2016-10-08 NOTE — Brief Op Note (Signed)
10/08/2016  1:56 PM  PATIENT:  Joseph Conway  60 y.o. 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  BLOOD ADMINISTERED:none  DRAINS: none   LOCAL MEDICATIONS USED:  MARCAINE     SPECIMEN:  Source of Specimen:  L45  DISPOSITION OF SPECIMEN:  PATHOLOGY  COUNTS:  YES  TOURNIQUET:  * No tourniquets in log *  DICTATION: .Other Dictation: Dictation Number [id number]  PLAN OF CARE: Admit for overnight observation  PATIENT DISPOSITION:  PACU - hemodynamically stable.   Delay start of Pharmacological VTE agent (>24hrs) due to surgical blood loss or risk of bleeding: yes

## 2016-10-08 NOTE — Anesthesia Preprocedure Evaluation (Signed)
Anesthesia Evaluation  Patient identified by MRN, date of birth, ID band Patient awake    Reviewed: Allergy & Precautions, H&P , NPO status , Patient's Chart, lab work & pertinent test results  History of Anesthesia Complications (+) PONV and history of anesthetic complications  Airway Mallampati: II   Neck ROM: full    Dental   Pulmonary sleep apnea ,    breath sounds clear to auscultation       Cardiovascular hypertension,  Rhythm:regular Rate:Normal     Neuro/Psych  Neuromuscular disease    GI/Hepatic   Endo/Other  diabetes, Type 2  Renal/GU      Musculoskeletal  (+) Arthritis ,   Abdominal   Peds  Hematology   Anesthesia Other Findings   Reproductive/Obstetrics                             Anesthesia Physical Anesthesia Plan  ASA: II  Anesthesia Plan: General   Post-op Pain Management:    Induction: Intravenous  Airway Management Planned: Oral ETT  Additional Equipment:   Intra-op Plan:   Post-operative Plan: Extubation in OR  Informed Consent: I have reviewed the patients History and Physical, chart, labs and discussed the procedure including the risks, benefits and alternatives for the proposed anesthesia with the patient or authorized representative who has indicated his/her understanding and acceptance.     Plan Discussed with: CRNA, Anesthesiologist and Surgeon  Anesthesia Plan Comments:         Anesthesia Quick Evaluation

## 2016-10-08 NOTE — Interval H&P Note (Signed)
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 After consideration of risks, benefits and other options for treatment, the patient has consented to  Procedure(s): LUMBAR LAMINECTOMY/DECOMPRESSION MICRODISCECTOMY 1 LEVELMICOR LUMBER DECOMPRESSION L4-5 (N/A) as a surgical intervention .  The patient's history has been reviewed, patient examined, no change in status, stable for surgery.  I have reviewed the patient's chart and labs.  Questions were answered to the patient's satisfaction.     Emanie Behan C

## 2016-10-08 NOTE — Transfer of Care (Signed)
Immediate Anesthesia Transfer of Care Note  Patient: Joseph Conway  Procedure(s) Performed: Procedure(s): CENTRAL LUMBAR DECOMPRESSION L4-5 AND MICRODISCECTOMY L4-5 (N/A)  Patient Location: PACU  Anesthesia Type:General  Level of Consciousness: sedated, patient cooperative and responds to stimulation  Airway & Oxygen Therapy: Patient Spontanous Breathing and Patient connected to face mask oxygen  Post-op Assessment: Report given to RN and Post -op Vital signs reviewed and stable  Post vital signs: Reviewed and stable  Last Vitals:  Vitals:   10/08/16 1048  BP: (!) 156/72  Pulse: 99  Resp: 16  Temp: 36.9 C    Last Pain:  Vitals:   10/08/16 1048  TempSrc: Oral  PainSc:       Patients Stated Pain Goal: 4 (10/08/16 1046)  Complications: No apparent anesthesia complications

## 2016-10-08 NOTE — Anesthesia Procedure Notes (Signed)
Procedure Name: Intubation Date/Time: 10/08/2016 1:43 PM Performed by: Illene SilverEVANS, Orvan Papadakis E Pre-anesthesia Checklist: Patient identified, Emergency Drugs available, Suction available and Patient being monitored Patient Re-evaluated:Patient Re-evaluated prior to inductionOxygen Delivery Method: Circle system utilized Preoxygenation: Pre-oxygenation with 100% oxygen Intubation Type: IV induction Ventilation: Mask ventilation without difficulty Tube type: Oral Tube size: 7.5 mm Number of attempts: 1 Airway Equipment and Method: Stylet,  Oral airway and Video-laryngoscopy Placement Confirmation: ETT inserted through vocal cords under direct vision,  positive ETCO2 and breath sounds checked- equal and bilateral Secured at: 22 cm Tube secured with: Tape Dental Injury: Teeth and Oropharynx as per pre-operative assessment

## 2016-10-08 NOTE — Progress Notes (Signed)
[place] and send down to holding with Vancomycin. Will start IV in Holding.

## 2016-10-08 NOTE — Interval H&P Note (Signed)
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 After consideration of risks, benefits and other options for treatment, the patient has consented to  Procedure(s): LUMBAR LAMINECTOMY/DECOMPRESSION MICRODISCECTOMY 1 LEVELMICOR LUMBER DECOMPRESSION L4-5 (N/A) as a surgical intervention .  The patient's history has been reviewed, patient examined, no change in status, stable for surgery.  I have reviewed the patient's chart and labs.  Questions were answered to the patient's satisfaction.     Shatarra Wehling C

## 2016-10-08 NOTE — H&P (View-Only) (Signed)
Joseph Conway is an 60 y.o. male.   Chief Complaint: Right followed by left lower extremity radicular pain. HPI: The patient reports low back symptoms including pain which began 1 month(s) ago without any known injury. Symptoms are reported to be located on the right side more than the left and Symptoms include pain, muscle spasm, numbness, burning, tingling and weakness. The pain radiates to the right buttock, right thigh, right posterior thigh, right lower leg and right foot. The patient describes the pain as sharp, dull, burning, aching, tingling and throbbing. The symptom onset was sudden. The patient describes the severity of their symptoms as severe. The patient feels as if the symptoms are worsening. Symptoms are exacerbated by standing, lifting and bending. Symptoms are relieved by nonsteroidal anti-inflammatory drugs and opioid analgesics. Current treatment includes opioid analgesics, oral corticosteroids and chiropractic manipulation. Pertinent medical history includes chronic back pain. Prior to being seen today the patient was previously evaluated in urgent care. Symptoms present at the patient's previous evaluation included back pain and numbness. Past evaluation has included x-ray of the lumbar spine, MRI of the lumbar spine and orthopedic evaluation. Past treatment has included opioid analgesics, muscle relaxants, corticosteroids and chiropractic manipulation. The patient states that this is not a Worker's Compensation case.  HISTORY OF PRESENT ILLNESS This is a very pleasant gentleman who presents here on referral with continued back and lower extremity radicular pain, worsened as of late on October 13 after a lifting episode. He is here with his wife. He has reported even worsening of the symptoms as of a week ago. 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 Disc protrusion at L3-L4. He also had had radiographs,  indicated mild degeneration in the spine. No fracture was seen.  He has a history of rheumatoid arthritis. He has been placed on prednisone for this pain is he has a baseline prednisone that he takes apparently 8 mg per day and he is taking now 8 mg tablets a day due to this increasing pain. There is a history of type 2 diabetes, vitamin B12 deficiency, rheumatoid arthritis as well as a hypertension. He has seen a neurologist for this as well. He was seen in [facility]. Given Toradol as well. He is taking ibuprofen and hydrocodone.  REVIEW OF SYSTEMS Review of systems is negative for fevers, chest pain, shortness of breath, unexplained recent weight loss, loss of bowel or bladder function, burning with urination, joint swelling, rashes, weakness or numbness, difficulty with balance, easy bruising, excessive thirst or frequent urination.  He has some pain in his neck with end-extension, but no upper extremity radicular pain.  Past Medical History:  Diagnosis Date  . Acute upper respiratory infections of other multiple sites   . Allergic rhinitis   . Asthma   . BPH (benign prostatic hypertrophy)   . Essential hypertension, benign   . Fatty liver   . GERD (gastroesophageal reflux disease)   . Hypertrophy of prostate with urinary obstruction and other lower urinary tract symptoms (LUTS)   . Impotence of organic origin   . Myalgia and myositis, unspecified   . Nonspecific elevation of levels of transaminase or lactic acid dehydrogenase (LDH)   . Obesity   . Other and unspecified hyperlipidemia   . Other malaise and fatigue   . Sleep apnea   . Tendon rupture, nontraumatic, quadriceps    Femoris tendon rupture x2  . Type II   or unspecified type diabetes mellitus without mention of complication, uncontrolled     No past surgical history on file.  Family History  Problem Relation Age of Onset  . Asthma Father   . Heart disease Maternal Uncle   . Heart disease Paternal Uncle   .  Heart disease Maternal Grandmother   . Heart disease Maternal Grandfather   . Cancer Paternal Grandmother     Stomach Cancer  . Heart disease Paternal Grandmother   . Heart disease Paternal Grandfather    Social History:  reports that he has never smoked. He has never used smokeless tobacco. He reports that he does not drink alcohol or use drugs.  Allergies:  Allergies  Allergen Reactions  . Cefaclor Anaphylaxis    Trouble breathing, blindness, had to be hospitalized. Tolerates penicillin.  . Levaquin [Levofloxacin] Other (See Comments)    Tore tendons  . Nabumetone Other (See Comments)    Unknown     (Not in a hospital admission)  No results found for this or any previous visit (from the past 48 hour(s)). No results found.  Review of Systems  Constitutional: Negative.   HENT: Negative.   Eyes: Negative.   Respiratory: Negative.   Cardiovascular: Negative.   Gastrointestinal: Negative.   Genitourinary: Negative.   Musculoskeletal: Positive for back pain.  Skin: Negative.   Neurological: Positive for sensory change and focal weakness.    There were no vitals taken for this visit. Physical Exam  Constitutional: He appears well-developed. He appears distressed.  HENT:  Head: Normocephalic.  Eyes: Pupils are equal, round, and reactive to light.  Neck: Normal range of motion.  Cardiovascular: Normal rate.   Respiratory: Effort normal.  GI: Soft.  Musculoskeletal:  On exam, moderate to severe distress. Mood and affect is appropriate. He walks with an antalgic gait. Straight leg raise bilaterally, buttock, thigh and calf pain. EHL 4+/5 bilaterally. He does have some altered sensation in the L5 dermatome. Limited extension of the lumbar spine.  Lumbar spine exam reveals no evidence of soft tissue swelling, deformity or skin ecchymosis. On palpation there is no tenderness of the lumbar spine. No flank pain with percussion. The abdomen is soft and nontender. Nontender over  the trochanters. No cellulitis or lymphadenopathy.  Motor is 5/5 including tibialis anterior, plantar flexion, quadriceps and hamstrings. Patient is normoreflexic. There is no Babinski or clonus. Patient has good distal pulses. No DVT. No pain and normal range of motion without instability of the hips, knees and ankles.  Cervical spine, some pain with end flexion.  Inspection of the cervical spine reveals a normal lordosis without evidence of paraspinous spasms or soft tissue swelling. Nontender to palpation. Full flexion, full extension, full left and right lateral rotation. Extension does not reproduce pain. Negative impingement sign, negative secondary impingement sign of the shoulders. Negative Tinel's median and ulnar nerves at the elbow. Negative carpal compression test at the wrist. Motor of the upper extremities is 5/5 including biceps, triceps, brachioradialis, wrist flexion, wrist extension, finger flexion, finger extension. Reflexes are normoreflexic. Sensory exam is intact to light touch. There is no Hoffmann sign. Nontender over the thoracic spine.  Neurological: He is alert.  Skin: Skin is warm and dry.   Three-view radiographs of the lumbar spine demonstrates a mild scoliosis. He has disc degeneration. 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  This patient's pain drawing is organic. He sees Dr. Gay for rheumatoid arthritis.  Assessment/Plan 1. Bilateral lower extremity radicular pain,   acute on chronic secondary to spinal stenosis with associated disc herniation. 2. Rheumatoid arthritis with a large disc herniation at 4-5 with severe stenosis.  We discussed options on him given the severity of symptoms and the disc herniation that is noted. We discussed proceeding with a microlumbar decompression. I had an extensive discussion of the risks and benefits of the lumbar decompression with the patient including bleeding, infection, damage to neurovascular  structures, epidural fibrosis, CSF leak requiring repair. We also discussed increase in pain, adjacent segment disease, recurrent disc herniation, need for future surgery including repeat decompression and/or fusion. We also discussed risks of postoperative hematoma, paralysis, anesthetic complications including DVT, PE, death, cardiopulmonary dysfunction. In addition, the perioperative and postoperative courses were discussed in detail including the rehabilitative time and return to functional activity and work. I provided the patient with an illustrated handout and utilized the appropriate surgical models.  We obtained AP and lateral flexion and extension radiographs of the cervical spine due to his rheumatoid arthritis to evaluate for any underlying C1-C2 instability. I do not see that.  He is currently on a dose of steroid, which he will be tapering back to his baseline. We will attempt to obtain surgical clearance and then proceed with a microlumbar decompression. I do not feel that epidural steroid series would be of particular benefit to him. I discussed a home flexion positioning, avoiding extension, favoring flexion. If he has any changes in bowel or bladder function, he has noted current numbness in his groin, he is to call. We will proceed with urgency. Again, if he is worse in the interim, he is to call. We discussed admission and decompression. We will obtain some additional information concerning his generalized condition in terms of cardiac and pulmonary. We will continue his methotrexate through the procedure and down to his baseline of steroid.  Plan microlumbar decompression L4-5  BISSELL, JACLYN M., PA-C for Dr. Beane 10/07/2016, 8:29 AM   

## 2016-10-08 NOTE — Discharge Instructions (Signed)

## 2016-10-09 ENCOUNTER — Encounter (HOSPITAL_COMMUNITY): Payer: Self-pay

## 2016-10-09 DIAGNOSIS — M5126 Other intervertebral disc displacement, lumbar region: Secondary | ICD-10-CM | POA: Diagnosis not present

## 2016-10-09 LAB — BASIC METABOLIC PANEL
Anion gap: 8 (ref 5–15)
BUN: 13 mg/dL (ref 6–20)
CALCIUM: 8.7 mg/dL — AB (ref 8.9–10.3)
CO2: 24 mmol/L (ref 22–32)
Chloride: 104 mmol/L (ref 101–111)
Creatinine, Ser: 0.53 mg/dL — ABNORMAL LOW (ref 0.61–1.24)
GFR calc Af Amer: >60 mL/min (ref >60)
GLUCOSE: 177 mg/dL — AB (ref 65–99)
Potassium: 4.1 mmol/L (ref 3.5–5.1)
Sodium: 136 mmol/L (ref 135–145)

## 2016-10-09 LAB — GLUCOSE, CAPILLARY: Glucose-Capillary: 155 mg/dL — ABNORMAL HIGH (ref 65–99)

## 2016-10-09 NOTE — Care Management Note (Signed)
Case Management Note  Patient Details  Name: Joseph Conway MRN: [id number] Date of Birth: [birth year]  Subjective/Objective:                  HNP Action/Plan: Discharge planning Expected Discharge Date:                  Expected Discharge Plan:  Home/Self Care  In-House Referral:     Discharge planning Services  CM Consult  Post Acute Care Choice:    Choice offered to:  NA  DME Arranged:  N/A DME Agency:  NA  HH Arranged:  NA HH Agency:  NA  Status of Service:  Completed, signed off  If discussed at Long Length of Stay Meetings, dates discussed:    Additional Comments: CM notes pt to go home with no HH services; none recc; none ordered. No DME needed.  No other CM needs were communicated. [name], BSN,CM [phone] 10/09/2016, 12:00 PM

## 2016-10-09 NOTE — Evaluation (Signed)
Physical Therapy Evaluation Patient Details Name: Joseph ServiceJoseph H Flanery MRN: [id number] DOB: [birth year] Today's Date: 10/09/2016   History of Present Illness  Pt admitted with fast onset back pain. Pt underwent L4-5 microdiskectomy.  Clinical Impression  The patient is mobilizing well.Ready for DC.    Follow Up Recommendations No PT follow up    Equipment Recommendations  None recommended by PT    Recommendations for Other Services       Precautions / Restrictions Precautions Precautions: Back Precaution Booklet Issued: Yes (comment) Restrictions Weight Bearing Restrictions: No      Mobility  Bed Mobility Overal bed mobility: Needs Assistance Bed Mobility: Rolling;Sidelying to Sit Rolling: Supervision Sidelying to sit: Supervision       General bed mobility comments: in recliner  Transfers Overall transfer level: Needs assistance Equipment used: None Transfers: Sit to/from Stand Sit to Stand: Supervision         General transfer comment: extra time to rise, cues for hands  Ambulation/Gait Ambulation/Gait assistance: Supervision Ambulation Distance (Feet): 150 Feet Assistive device: None Gait Pattern/deviations: Step-through pattern     General Gait Details: slow speed.  Stairs Stairs: Yes Stairs assistance: Min guard Stair Management: One rail Left Number of Stairs: 2 General stair comments: cues for sequqnce  Wheelchair Mobility    Modified Rankin (Stroke Patients Only)       Balance Overall balance assessment: Needs assistance Sitting-balance support: Feet supported Sitting balance-Leahy Scale: Good     Standing balance support: Single extremity supported;During functional activity Standing balance-Leahy Scale: Fair                               Pertinent Vitals/Pain Pain Assessment: Faces Faces Pain Scale: Hurts little more Pain Location: back incision Pain Descriptors / Indicators: Tightness;Discomfort Pain  Intervention(s): Patient requesting pain meds-RN notified;Monitored during session    Home Living Family/patient expects to be discharged to:: Private residence Living Arrangements: Spouse/significant other Available Help at Discharge: Available 24 hours/day;Family Type of Home: House Home Access: Stairs to enter Entrance Stairs-Rails: Left Entrance Stairs-Number of Steps: 3 Home Layout: One level Home Equipment: Bedside commode;Walker - 2 wheels;Shower seat - built in;Grab bars - tub/shower;Other (comment) (adjustable bed)      Prior Function Level of Independence: Independent               Hand Dominance   Dominant Hand: Right    Extremity/Trunk Assessment   Upper Extremity Assessment: Defer to OT evaluation           Lower Extremity Assessment: Overall WFL for tasks assessed      Cervical / Trunk Assessment: Normal  Communication   Communication: No difficulties  Cognition Arousal/Alertness: Awake/alert Behavior During Therapy: WFL for tasks assessed/performed Overall Cognitive Status: Within Functional Limits for tasks assessed                      General Comments General comments (skin integrity, edema, etc.): Spoke at length about back precuations and proper body mechanics for all adls and for his job. 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    Exercises     Assessment/Plan    PT Assessment Patent does not need any further PT services  PT Problem List            PT Treatment Interventions      PT Goals (Current goals can be  found in the Care Plan section)  Acute Rehab PT Goals Patient Stated Goal: to get home to my dogs PT Goal Formulation: All assessment and education complete, DC therapy    Frequency     Barriers to discharge        Co-evaluation               End of Session   Activity Tolerance: Patient tolerated treatment well Patient left: with call  bell/phone within reach;with family/visitor present Nurse Communication: Mobility status    Functional Assessment Tool Used: clinical judgement Functional Limitation: Mobility: Walking and moving around Mobility: Walking and Moving Around Current Status ([phone]): At least 1 percent but less than 20 percent impaired, limited or restricted Mobility: Walking and Moving Around Goal Status [phone]): At least 1 percent but less than 20 percent impaired, limited or restricted Mobility: Walking and Moving Around Discharge Status [phone]): At least 1 percent but less than 20 percent impaired, limited or restricted    Time: 1029-1040 PT Time Calculation (min) (ACUTE ONLY): 11 min   Charges:   PT Evaluation $PT Eval Low Complexity: 1 Procedure     PT G Codes:   PT G-Codes **NOT FOR INPATIENT CLASS** Functional Assessment Tool Used: clinical judgement Functional Limitation: Mobility: Walking and moving around Mobility: Walking and Moving Around Current Status ([phone]): At least 1 percent but less than 20 percent impaired, limited or restricted Mobility: Walking and Moving Around Goal Status [phone]): At least 1 percent but less than 20 percent impaired, limited or restricted Mobility: Walking and Moving Around Discharge Status [phone]([phone]): At least 1 percent but less than 20 percent impaired, limited or restricted    Joseph Conway, Joseph Conway 10/09/2016, 11:17 AM

## 2016-10-09 NOTE — Discharge Summary (Signed)
Physician Discharge Summary   Patient ID: Joseph Conway MRN: [id number] DOB/AGE: [birth year] 59 y.o.  Admit date: 10/08/2016 Discharge date: 10/09/2016  Primary Diagnosis:   HNP STENONSIS L4-5  Admission Diagnoses:  Past Medical History:  Diagnosis Date  . Acute upper respiratory infections of other multiple sites   . Allergic rhinitis   . Arthritis    rheumatoid   . BPH (benign prostatic hypertrophy)   . Essential hypertension, benign   . Fatty liver   . Hypertrophy of prostate with urinary obstruction and other lower urinary tract symptoms (LUTS)   . Impotence of organic origin   . Myalgia and myositis, unspecified   . Nonspecific elevation of levels of transaminase or lactic acid dehydrogenase (LDH)   . Obesity   . Other and unspecified hyperlipidemia   . Other malaise and fatigue   . Pneumonia    hx of 01/2016   . PONV (postoperative nausea and vomiting)   . Sleep apnea    lost weight and no cpap has not used in several years   . Tendon rupture, nontraumatic, quadriceps    Femoris tendon rupture x2  . Type II or unspecified type diabetes mellitus without mention of complication, uncontrolled    type II   Discharge Diagnoses:   Principal Problem:   HNP (herniated nucleus pulposus), lumbar Active Problems:   Spinal stenosis of lumbar region  Procedure:  Procedure(s) (LRB): CENTRAL LUMBAR DECOMPRESSION L4-5 AND MICRODISCECTOMY L4-5 (N/A)   Consults: None  HPI:  see H&P    Laboratory Data: Hospital Outpatient Visit on 10/07/2016  Component Date Value Ref Range Status  . MRSA, PCR 10/07/2016 NEGATIVE  NEGATIVE Final  . Staphylococcus aureus 10/07/2016 NEGATIVE  NEGATIVE Final   Comment:        The Xpert SA Assay (FDA approved for NASAL specimens in patients over 72 years of age), is one component of a comprehensive surveillance program.  Test performance has been validated by [facility] for patients greater than or equal to 6 year old. It is not  intended to diagnose infection nor to guide or monitor treatment.   . Sodium 10/07/2016 135  135 - 145 mmol/L Final  . Potassium 10/07/2016 4.0  3.5 - 5.1 mmol/L Final  . Chloride 10/07/2016 104  101 - 111 mmol/L Final  . CO2 10/07/2016 23  22 - 32 mmol/L Final  . Glucose, Bld 10/07/2016 116* 65 - 99 mg/dL Final  . BUN 10/07/2016 19  6 - 20 mg/dL Final  . Creatinine, Ser 10/07/2016 0.63  0.61 - 1.24 mg/dL Final  . Calcium 10/07/2016 9.1  8.9 - 10.3 mg/dL Final  . GFR calc non Af Amer 10/07/2016 >60  >60 mL/min Final  . GFR calc Af Amer 10/07/2016 >60  >60 mL/min Final   Comment: (NOTE) The eGFR has been calculated using the CKD EPI equation. This calculation has not been validated in all clinical situations. eGFR's persistently <60 mL/min signify possible Chronic Kidney Disease.   . Anion gap 10/07/2016 8  5 - 15 Final  . WBC 10/07/2016 9.2  4.0 - 10.5 K/uL Final  . RBC 10/07/2016 4.94  4.22 - 5.81 MIL/uL Final  . Hemoglobin 10/07/2016 16.2  13.0 - 17.0 g/dL Final  . HCT 10/07/2016 47.0  39.0 - 52.0 % Final  . MCV 10/07/2016 95.1  78.0 - 100.0 fL Final  . MCH 10/07/2016 32.8  26.0 - 34.0 pg Final  . MCHC 10/07/2016 34.5  30.0 - 36.0 g/dL Final  .  RDW 10/07/2016 14.5  11.5 - 15.5 % Final  . Platelets 10/07/2016 204  150 - 400 K/uL Final  . ABO/RH(D) 10/08/2016 O POS   Final  . Antibody Screen 10/08/2016 NEG   Final  . Sample Expiration 10/08/2016 10/11/2016   Final  . Extend sample reason 10/08/2016 NO TRANSFUSIONS OR PREGNANCY IN THE PAST 3 MONTHS   Final  . Hgb A1c MFr Bld 10/08/2016 6.3* 4.8 - 5.6 % Final   Comment: (NOTE)         Pre-diabetes: 5.7 - 6.4         Diabetes: >6.4         Glycemic control for adults with diabetes: <7.0   . Mean Plasma Glucose 10/08/2016 134  mg/dL Final   Comment: (NOTE) Performed At: [facility] [place], [place] [id number] Lindon Romp MD [id number]   . Glucose-Capillary 10/07/2016 142* 65 - 99 mg/dL Final    . ABO/RH(D) 10/07/2016 O POS   Final    Recent Labs  10/07/16 1049  HGB 16.2    Recent Labs  10/07/16 1049  WBC 9.2  RBC 4.94  HCT 47.0  PLT 204    Recent Labs  10/07/16 1049 10/09/16 0415  NA 135 136  K 4.0 4.1  CL 104 104  CO2 23 24  BUN 19 13  CREATININE 0.63 0.53*  GLUCOSE 116* 177*  CALCIUM 9.1 8.7*   No results for input(s): LABPT, INR in the last 72 hours.  X-Rays:Dg Lumbar Spine 2-3 Views  Result Date: 10/07/2016 CLINICAL DATA:  Preoperative lumbar laminectomy EXAM: LUMBAR SPINE - 2-3 VIEW COMPARISON:  None. FINDINGS: Standing frontal and standing lateral views were obtained. There are 5 non-rib-bearing lumbar type vertebral bodies. There is no fracture or spondylolisthesis. There is moderately severe disc space narrowing at T12-L1, L1-2, L3-4, and L5-S1. There is milder disc space narrowing at all other levels. There are anterior osteophytes at all levels. No erosive change. IMPRESSION: Multilevel arthropathy. Disc space narrowing is most marked at T12-L1, L1-2, L3-4, and L5-S1. No fracture or spondylolisthesis. Electronically Signed   By: Lowella Grip III M.D.   On: 10/07/2016 11:43   Dg Spine Portable 1 View  Result Date: 10/08/2016 CLINICAL DATA:  Intraoperative imaging for spine surgery. EXAM: PORTABLE SPINE - 1 VIEW COMPARISON:  10/08/2016 at 1405 hours FINDINGS: Single lateral view of the lumbar spine was obtained. There is a surgical marking instrument at the L4-L5 disc space. Evidence for posterior soft tissue retractors at the same level. Again noted is mild anterolisthesis at L4-L5. IMPRESSION: Surgical marking at L4-L5. Electronically Signed   By: Markus Daft M.D.   On: 10/08/2016 15:17   Dg Spine Portable 1 View  Result Date: 10/08/2016 CLINICAL DATA:  Intraoperative film EXAM: PORTABLE SPINE - 1 VIEW COMPARISON:  Prior film same day FINDINGS: Single lateral view of the lumbar spine submitted. There is a posterior localization instrument at the  level of upper endplate of L5. IMPRESSION: Posterior instrument at the level of upper endplate of L5. Electronically Signed   By: Lahoma Crocker M.D.   On: 10/08/2016 14:30   Dg Spine Portable 1 View  Result Date: 10/08/2016 CLINICAL DATA:  Intraoperative localization EXAM: PORTABLE SPINE - 1 VIEW COMPARISON:  10/07/2016 FINDINGS: Posterior needles are noted, directed toward the L4-5 and L5-S1 levels. IMPRESSION: Intraoperative localization as above. Electronically Signed   By: Rolm Baptise M.D.   On: 10/08/2016 14:18    EKG: Orders placed  or performed during the hospital encounter of 10/07/16  . EKG 12 lead  . EKG 12 lead     Hospital Course: Patient was admitted to [facility] and taken to the OR and underwent the above state procedure without complications.  Patient tolerated the procedure well and was later transferred to the recovery room and then to the orthopaedic floor for postoperative care.  They were given PO and IV analgesics for pain control following their surgery.  They were given 24 hours of postoperative antibiotics.   PT was consulted postop to assist with mobility and transfers.  The patient was allowed to be WBAT with therapy and was taught back precautions. Discharge planning was consulted to help with postop disposition and equipment needs.  Patient had a good night on the evening of surgery and started to get up OOB with therapy on day one. Patient was seen in rounds and was ready to go home on day one.  They were given discharge instructions and dressing directions.  They were instructed on when to follow up in the office with Dr. Tonita Cong.   Diet: Regular diet Activity:WBAT; Lspine precautions Follow-up:in 10-14 days Disposition - Home Discharged Condition: good   Discharge Instructions    Call MD / Call 911    Complete by:  As directed    If you experience chest pain or shortness of breath, CALL 911 and be transported to the hospital emergency room.  If you  develope a fever above 101 F, pus (white drainage) or increased drainage or redness at the wound, or calf pain, call your surgeon's office.   Constipation Prevention    Complete by:  As directed    Drink plenty of fluids.  Prune juice may be helpful.  You may use a stool softener, such as Colace (over the counter) 100 mg twice a day.  Use MiraLax (over the counter) for constipation as needed.   Diet - low sodium heart healthy    Complete by:  As directed    Increase activity slowly as tolerated    Complete by:  As directed        Medication List    TAKE these medications   aspirin EC 81 MG tablet Take 1 tablet (81 mg total) by mouth at bedtime. Resume 4 days post-op What changed:  additional instructions   docusate sodium 100 MG capsule Commonly known as:  COLACE Take 1 capsule (100 mg total) by mouth 2 (two) times daily as needed for mild constipation.   fluticasone 50 MCG/ACT nasal spray Commonly known as:  FLONASE Place 2 sprays into both nostrils daily as needed for allergies.   folic acid 1 MG tablet Commonly known as:  FOLVITE Take 1 mg by mouth daily.   gabapentin 300 MG capsule Commonly known as:  NEURONTIN Take 300 mg by mouth See admin instructions. Started taking on 10/01/16. 370m at bedtime x 3, 3013mtwice a day x 3 days, then 30059mhree times a day.   lisinopril 20 MG tablet Commonly known as:  PRINIVIL,ZESTRIL Take 10 mg by mouth daily.   methocarbamol 500 MG tablet Commonly known as:  ROBAXIN Take 1 tablet (500 mg total) by mouth every 6 (six) hours as needed for muscle spasms.   methotrexate 2.5 MG tablet Commonly known as:  RHEUMATREX Take 12.5 mg by mouth once a week. Takes on Saturdays. Caution:Chemotherapy. Protect from light.   montelukast 10 MG tablet Commonly known as:  SINGULAIR Take 10 mg by mouth  at bedtime.   oxyCODONE-acetaminophen 5-325 MG tablet Commonly known as:  PERCOCET Take 1-2 tablets by mouth every 4 (four) hours as needed  for severe pain. What changed:  how much to take  when to take this  reasons to take this   pioglitazone-metformin 15-850 MG tablet Commonly known as:  ACTOPLUS MET Take 1 tablet by mouth 2 (two) times daily with a meal.   polyethylene glycol packet Commonly known as:  MIRALAX / GLYCOLAX Take 17 g by mouth daily.   tamsulosin 0.4 MG Caps capsule Commonly known as:  FLOMAX Take 0.4 mg by mouth at bedtime.      Follow-up Information    BEANE,JEFFREY C, MD Follow up in 2 week(s).   Specialty:  Orthopedic Surgery Contact information: [address] [place] [address] [phone]        Johnn Hai, MD In 2 weeks.   Specialty:  Orthopedic Surgery Contact information: [address] [place] [address] [phone]           Signed: Lacie Draft, PA-C Orthopaedic Surgery 10/09/2016, 1:19 PM

## 2016-10-09 NOTE — Anesthesia Postprocedure Evaluation (Signed)
Anesthesia Post Note  Patient: Boston ServiceJoseph H Smoots  Procedure(s) Performed: Procedure(s) (LRB): CENTRAL LUMBAR DECOMPRESSION L4-5 AND MICRODISCECTOMY L4-5 (N/A)  Patient location during evaluation: PACU Anesthesia Type: General Level of consciousness: awake and alert Pain management: pain level controlled Vital Signs Assessment: post-procedure vital signs reviewed and stable Respiratory status: spontaneous breathing, nonlabored ventilation, respiratory function stable and patient connected to nasal cannula oxygen Cardiovascular status: blood pressure returned to baseline and stable Postop Assessment: no signs of nausea or vomiting Anesthetic complications: no    Last Vitals:  Vitals:   10/09/16 0146 10/09/16 0552  BP: 103/66 117/67  Pulse: 90 85  Resp: 18 18  Temp: 36.9 C 36.4 C    Last Pain:  Vitals:   10/09/16 0552  TempSrc: Oral  PainSc:                  Phillips Groutarignan, Skyelar Swigart

## 2016-10-09 NOTE — Progress Notes (Signed)
Subjective: 1 Day Post-Op Procedure(s) (LRB): CENTRAL LUMBAR DECOMPRESSION L4-5 AND MICRODISCECTOMY L4-5 (N/A) Patient reports pain as mild. Reports leg pain much improved at this point. Back pain well controlled. Would like to go home.  Objective: Vital signs in last 24 hours: Temp:  [97.5 F (36.4 C)-99.3 F (37.4 C)] 99.3 F (37.4 C) (11/30 0949) Pulse Rate:  [85-103] 101 (11/30 0949) Resp:  [11-20] 18 (11/30 0949) BP: (103-156)/(54-82) 122/77 (11/30 0949) SpO2:  [95 %-100 %] 99 % (11/30 0949) Weight:  [95.7 kg (211 lb)] 95.7 kg (211 lb) (11/29 1700)  Intake/Output from previous day: 11/29 0701 - 11/30 0700 In: 2837.1 [P.O.:360; I.V.:2477.1] Out: 1935 [Urine:1925; Blood:10] Intake/Output this shift: Total I/O In: 240 [P.O.:240] Out: -    Recent Labs  10/07/16 1049  HGB 16.2    Recent Labs  10/07/16 1049  WBC 9.2  RBC 4.94  HCT 47.0  PLT 204    Recent Labs  10/07/16 1049 10/09/16 0415  NA 135 136  K 4.0 4.1  CL 104 104  CO2 23 24  BUN 19 13  CREATININE 0.63 0.53*  GLUCOSE 116* 177*  CALCIUM 9.1 8.7*   No results for input(s): LABPT, INR in the last 72 hours.  Neurologically intact ABD soft Neurovascular intact Sensation intact distally Intact pulses distally Dorsiflexion/Plantar flexion intact Incision: dressing C/D/I and no drainage No cellulitis present Compartment soft no calf pain or sign of DVT  Assessment/Plan: 1 Day Post-Op Procedure(s) (LRB): CENTRAL LUMBAR DECOMPRESSION L4-5 AND MICRODISCECTOMY L4-5 (N/A) Advance diet Up with therapy D/C IV fluids  Discussed d/C instructions, dressing instructions, Lspine precautions D/C home today Follow up 10-14 days in office  BISSELL, JACLYN M. 10/09/2016, 10:18 AM

## 2016-10-09 NOTE — Evaluation (Signed)
Occupational Therapy Evaluation and Discharge Summary Patient Details Name: Joseph Conway MRN: [id number] DOB: 11-17-1955 Today's Date: 10/09/2016    History of Present Illness Pt admitted with fast onset back pain. Pt underwent L4-5 microdiskectomy.   Clinical Impression   Pt admitted with the above diagnosis and overall is doing well with adls. Pt requires min assist with LE adls but has adaptive equipment and wife home top assist through the weekend.  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  Spoke to pt at length about using proper body mechanics during job tasks.  Pt is not in need of further OT services.     Follow Up Recommendations  No OT follow up;Supervision/Assistance - 24 hour (24 hour S just for first day or two)    Equipment Recommendations  None recommended by OT    Recommendations for Other Services       Precautions / Restrictions Precautions Precautions: Back Precaution Booklet Issued: Yes (comment) Restrictions Weight Bearing Restrictions: No      Mobility Bed Mobility Overal bed mobility: Needs Assistance Bed Mobility: Rolling;Sidelying to Sit Rolling: Supervision Sidelying to sit: Supervision       General bed mobility comments: Pt required cues for log roll only. Pt has adjustable bed at home.  Transfers Overall transfer level: Needs assistance Equipment used: 1 person hand held assist Transfers: Sit to/from Stand Sit to Stand: Min guard         General transfer comment: Min guard since first time up.    Balance Overall balance assessment: Needs assistance Sitting-balance support: Feet supported Sitting balance-Leahy Scale: Good     Standing balance support: Single extremity supported;During functional activity Standing balance-Leahy Scale: Fair                               ADL Overall ADL's : Needs assistance/impaired Eating/Feeding: Independent;Sitting   Grooming: Oral care;Wash/dry face;Wash/dry hands;Standing;Supervision/safety   Upper Body Bathing: Set up;Sitting   Lower Body Bathing: Minimal assistance;Sit to/from stand   Upper Body Dressing : Set up;Sitting   Lower Body Dressing: Minimal assistance;Sit to/from stand Lower Body Dressing Details (indicate cue type and reason): needs encouragement to do for himself Toilet Transfer: Min guard;Comfort height toilet Toilet Transfer Details (indicate cue type and reason): cues for hand placement Toileting- Clothing Manipulation and Hygiene: Sit to/from stand;Min guard   Tub/ Shower Transfer: Minimal assistance;Walk-in shower;Ambulation;Grab bars Tub/Shower Transfer Details (indicate cue type and reason): min assist to maintain balance  Functional mobility during ADLs: Minimal assistance General ADL Comments: Pt did well with adls.  Pt requires assist with LE adls due to pain in back and inability to access feet.  Wife is home to assist but pt also has sock aid.       Vision Vision Assessment?: No apparent visual deficits   Perception     Praxis      Pertinent Vitals/Pain Pain Assessment: Faces Faces Pain Scale: Hurts little more Pain Location: back at incision siet Pain Descriptors / Indicators: Aching;Operative site guarding Pain Intervention(s): Limited activity within patient's tolerance;Monitored during session;Premedicated before session;Repositioned     Hand Dominance Right   Extremity/Trunk Assessment Upper Extremity Assessment Upper Extremity Assessment: Overall WFL for tasks assessed   Lower Extremity Assessment Lower Extremity Assessment: Defer to PT  evaluation       Communication Communication Communication: No difficulties   Cognition Arousal/Alertness: Awake/alert Behavior During Therapy: WFL for tasks assessed/performed Overall Cognitive Status: Within  Functional Limits for tasks assessed                     General Comments       Exercises       Shoulder Instructions      Home Living Family/patient expects to be discharged to:: Private residence Living Arrangements: Spouse/significant other Available Help at Discharge: Available 24 hours/day;Family Type of Home: House Home Access: Stairs to enter Entergy CorporationEntrance Stairs-Number of Steps: 3 Entrance Stairs-Rails: Left Home Layout: One level     Bathroom Shower/Tub: Walk-in shower;Door   Bathroom Toilet: Handicapped height     Home Equipment: Bedside commode;Walker - 2 wheels;Shower seat - built in;Grab bars - tub/shower;Other (comment) (adjustable bed)          Prior Functioning/Environment Level of Independence: Independent                 OT Problem List: Impaired balance (sitting and/or standing);Pain;Decreased knowledge of use of DME or AE   OT Treatment/Interventions:      OT Goals(Current goals can be found in the care plan section) Acute Rehab OT Goals Patient Stated Goal: to get home to my dogs OT Goal Formulation: All assessment and education complete, DC therapy  OT Frequency:     Barriers to D/C:            Co-evaluation              End of Session Nurse Communication: Mobility status  Activity Tolerance: Patient tolerated treatment well Patient left: in chair;with call bell/phone within reach;with family/visitor present   Time: [id number] OT Time Calculation (min): 23 min Charges:  OT General Charges $OT Visit: 1 Procedure OT Evaluation $OT Eval Low Complexity: 1 Procedure OT Treatments $Self Care/Home Management : 8-22 mins G-Codes: OT G-codes **NOT FOR INPATIENT CLASS** Functional Assessment Tool Used: clinical judgement Functional Limitation: Self care Self Care Current Status (E9528(G8987): At least 1 percent but less than 20 percent impaired, limited or restricted Self Care Goal Status (U1324(G8988): At least 1 percent but less  than 20 percent impaired, limited or restricted Self Care Discharge Status [phone](G8989): At least 1 percent but less than 20 percent impaired, limited or restricted  Hope BuddsJones, Sumiya Mamaril Anne 10/09/2016, 9:21 AM [phone]

## 2016-10-09 NOTE — Op Note (Signed)
NAME:  Joseph Conway, Joseph Conway                ACCOUNT NO.:  [id number]  MEDICAL RECORD NO.:  [id number]  LOCATION:                                 FACILITY:  PHYSICIAN:  Jene EveryJeffrey Brighten Orndoff, M.D.    DATE OF BIRTH:  [birth year]  DATE OF PROCEDURE:  10/08/2016 DATE OF DISCHARGE:                              OPERATIVE REPORT   PREOPERATIVE DIAGNOSES:  Spinal stenosis, herniated nucleus pulposus at L4-L5 bilaterally.  POSTOPERATIVE DIAGNOSES:  Spinal stenosis, herniated nucleus pulposus at L4-L5 bilaterally.  PROCEDURE PERFORMED: 1. Bilateral lumbar decompression, L4-L5 with bilateral L4 and L5     foraminotomies. 2. Microdiskectomy, L4-L5.  ANESTHESIA:  General.  ASSISTANT:  Lanna PocheJacqueline Bissell, PA.  HISTORY:  A 60 year old with bilateral lower extremity radicular pain secondary to severe spinal stenosis, multifactorial due to disk herniation, spinal stenosis and HNP with severe compression of the thecal sac.  He was indicated for microlumbar decompression with bilateral symptomatology.  Risk and benefits discussed including bleeding, infection, damage to the neurovascular structures, no change in symptoms, worsening symptoms, DVT, PE, anesthetic complications, etc.  TECHNIQUE:  With the patient in supine position, after induction of adequate general anesthesia, 1 g vancomycin, was placed prone on the [place] frame.  All bony prominences were well padded.  Lumbar region was prepped and draped in usual sterile fashion.  Two 18-gauge spinal needle was utilized to localize L4-L5 interspace, confirmed with x-ray. Incision was made in spinous process, L4-L5.  Subcutaneous tissue was dissected.  Electrocautery was utilized to achieve hemostasis.  A 0.25% Marcaine with epinephrine was infiltrated in perimuscular tissue. Identified and divided the dorsolumbar fascia in line with the skin incision.  Paraspinous muscle elevated from lamina 4-5.  Operating microscope was draped and brought on the  surgical field.  I used a Leksell rongeur to remove the spinous process of 4 to gain access to the interlaminar window, which was fairly small.  Hemilaminotomy of the caudad edge of 5 was performed with a 2 mm Kerrison bilaterally to detach the ligamentum flavum.  Ligamentum flavum detached from cephalad edge of 5 with a straight curette.  Neuro patty placed beneath the ligamentum flavum.  Foraminotomies of 5 were performed.  Ligamentum flavum was removed bilaterally.  We decompressed lateral recess in the medial border of the pedicle.  Just to allow for mobility of the thecal sac due to the severe compression of thecal sac noted by disk herniation on the left side.  We identified the 5 root and mobilized it medially. Identified an extruded fragment, gently mobilized it with a ball-tipped nerve hook to the left retrieving multiple large fragments from beneath thecal sac very gently.  We mobilized the plane between the thecal sac and the disk herniation with a Woodson retracted without difficulty.  We identified the disk space and removed copious portion of disk material from the disk space as well.  Irrigated with catheter lavage and distal fragments were retrieved.  We checked the right-side, no disk herniation noted there.  Foraminotomies L4 then performed bilaterally.  Neural probe passed freely up the foramen 4 and 5 bilaterally.  Good restoration of the thecal sac and mobility of the 5 root with 1  cm excursion of the 5 root medial pedicle without tension.  A Woodson retractor probe passed freely above the pedicle of 4 and below 5 without difficulty.  No CSF leakage or active bleeding.  We used thrombin-soaked Gelfoam and bipolar cautery to utilize to achieve hemostasis. Confirmatory radiograph obtained.  We removed the [place] retractor. No active bleeding.  We closed the dorsolumbar fascia with 1 Vicryl, subcu with 2-0, and skin with Prolene.  Sterile dressing applied. Placed  supine on the hospital bed, extubated without difficulty, and transported to the recovery in satisfactory condition.  The patient tolerated the procedure well.  No complications.  Assistant, Lanna PocheJacqueline Bissell, PA and minimal blood loss.     Jene EveryJeffrey Avree Szczygiel, M.D.   ______________________________ Jene EveryJeffrey Avaree Gilberti, M.D.    Cordelia PenJB/MEDQ  D:  10/08/2016  T:  10/09/2016  Job:  [id number]

## 2020-08-10 ENCOUNTER — Other Ambulatory Visit: Payer: Self-pay | Admitting: Specialist

## 2020-08-10 DIAGNOSIS — G8929 Other chronic pain: Secondary | ICD-10-CM

## 2020-08-10 DIAGNOSIS — M545 Low back pain, unspecified: Secondary | ICD-10-CM

## 2020-08-20 ENCOUNTER — Inpatient Hospital Stay: Admission: RE | Admit: 2020-08-20 | Payer: 59 | Source: Ambulatory Visit

## 2020-08-22 ENCOUNTER — Other Ambulatory Visit: Payer: Self-pay

## 2020-08-22 ENCOUNTER — Ambulatory Visit
Admission: RE | Admit: 2020-08-22 | Discharge: 2020-08-22 | Disposition: A | Discharge status: Home / Self Care | Payer: BLUE CROSS/BLUE SHIELD | Source: Ambulatory Visit | Attending: Specialist | Admitting: Specialist

## 2020-08-22 DIAGNOSIS — M545 Low back pain, unspecified: Secondary | ICD-10-CM

## 2020-08-22 DIAGNOSIS — G8929 Other chronic pain: Secondary | ICD-10-CM

## 2020-08-22 MED ORDER — METHYLPREDNISOLONE ACETATE 40 MG/ML INJ SUSP (RADIOLOG
120.0000 mg | Freq: Once | INTRAMUSCULAR | Status: AC
  Administered 2020-08-22: 120 mg via EPIDURAL

## 2020-08-22 MED ORDER — IOPAMIDOL (ISOVUE-M 200) INJECTION 41%
1.0000 mL | Freq: Once | INTRAMUSCULAR | Status: AC
  Administered 2020-08-22: 1 mL via EPIDURAL

## 2020-08-22 NOTE — Discharge Instructions (Signed)

## 2022-01-22 IMAGING — XA Imaging study
2 series · 2 of 2 positions shown · IV contrast (omnipaque)
Comparison: none

CLINICAL DATA: PREVIOUS LUMBAR SURGERY L4-5, L5-S1. LOW BACK AND
LEFT LOWER EXTREMITY PAIN.

EXAM:
SELECTIVE NERVE ROOT BLOCK AND TRANSFORAMINAL EPIDURAL STEROID
INJECTION UNDER FLUOROSCOPY
FLUOROSCOPY TIME:  54 SECONDS; 53 uEymF DAP
TECHNIQUE: The procedure, risks (including but not limited to bleeding,
infection, organ damage ), benefits, and alternatives were explained
to the patient. Questions regarding the procedure were encouraged
and answered. The patient understands and consents to the procedure.
An appropriate skin entry site was determined under fluoroscopy.
Operator donned sterile gloves and mask. Site was marked, prepped
with Betadine, draped in usual sterile fashion, infiltrated locally
with 1% lidocaine. A 22 gauge spinal needle was advanced to the
superior ventral margin of the left L4-5 neural foramen. Diagnostic
injection of 2 ml Omnipaque 180 showed partial outlining of the
exiting nerve root as well as epidural extension of contrast, with
no intravascular or subarachnoid component. 120 mg Depo-Medrol in 3
ml lidocaine 1% was administered. The patient tolerated procedure
well, with no immediate complication.

[Series 1: ortho standard · 1 of 1 slices shown (1 of 2)]
[im 1/1]
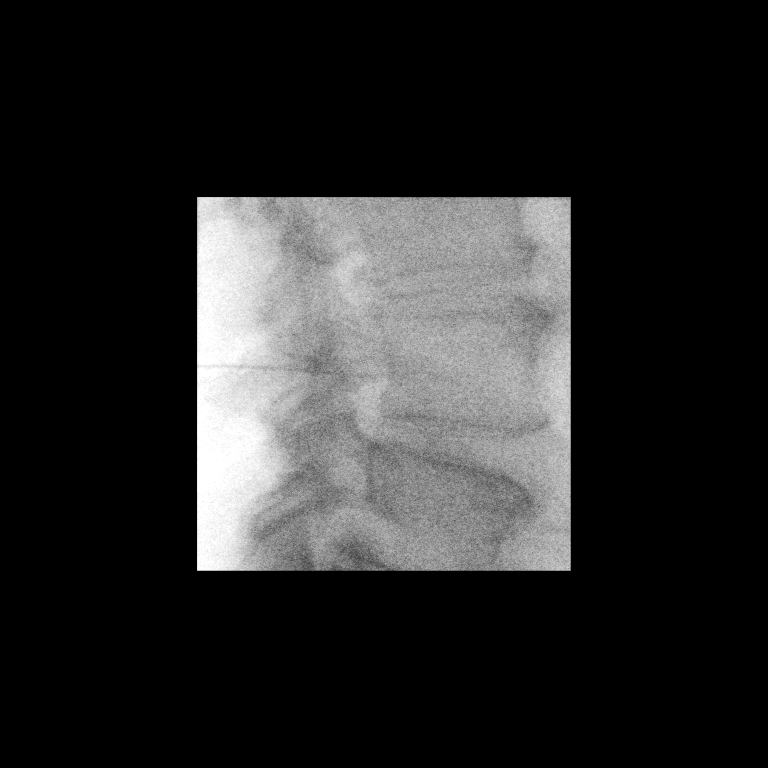

[Series 3: ortho standard · 1 of 1 slices shown (2 of 2)]
[im 1/1]
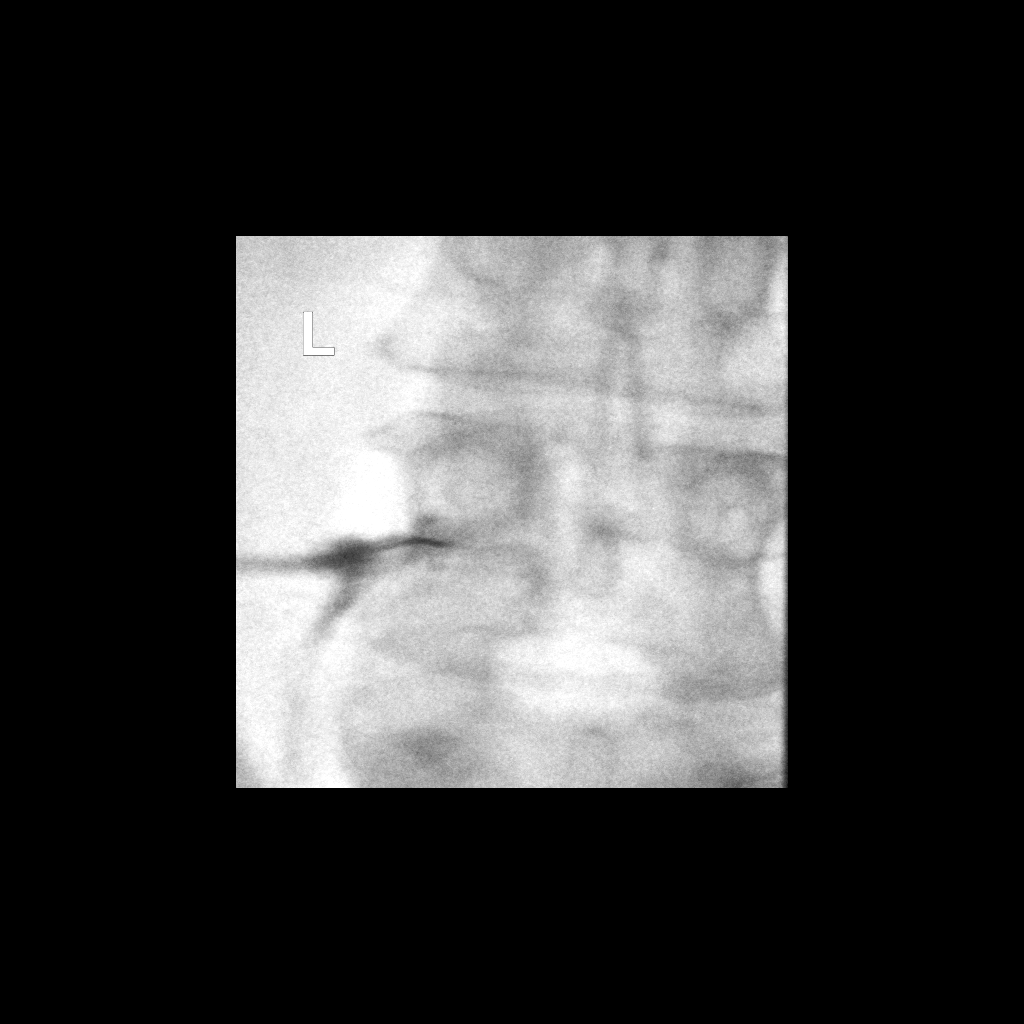

[2 of 2 positions shown; findings below may reference images not displayed]

IMPRESSION: 1. Technically successful left L4-5 selective nerve root block and
transforaminal epidural steroid injection

## 2024-01-06 ENCOUNTER — Other Ambulatory Visit: Payer: Self-pay | Admitting: Specialist

## 2024-01-06 DIAGNOSIS — M545 Low back pain, unspecified: Secondary | ICD-10-CM
